# Patient Record
Sex: Female | Born: 1965 | Race: Black or African American | Hispanic: No
Health system: Southern US, Community
[De-identification: ages and names within clinical notes are randomized; demographics above are authoritative.]

---

## 2011-10-12 DIAGNOSIS — N949 Unspecified condition associated with female genital organs and menstrual cycle: Secondary | ICD-10-CM | POA: Insufficient documentation

## 2012-04-14 DIAGNOSIS — J3489 Other specified disorders of nose and nasal sinuses: Secondary | ICD-10-CM | POA: Insufficient documentation

## 2012-04-14 DIAGNOSIS — R430 Anosmia: Secondary | ICD-10-CM | POA: Insufficient documentation

## 2014-03-19 DIAGNOSIS — N3090 Cystitis, unspecified without hematuria: Secondary | ICD-10-CM | POA: Insufficient documentation

## 2016-05-05 DIAGNOSIS — R922 Inconclusive mammogram: Secondary | ICD-10-CM | POA: Insufficient documentation

## 2017-03-25 DIAGNOSIS — R4789 Other speech disturbances: Secondary | ICD-10-CM | POA: Insufficient documentation

## 2017-03-25 DIAGNOSIS — F449 Dissociative and conversion disorder, unspecified: Secondary | ICD-10-CM | POA: Insufficient documentation

## 2017-03-25 DIAGNOSIS — R531 Weakness: Secondary | ICD-10-CM | POA: Insufficient documentation

## 2017-03-25 DIAGNOSIS — I82619 Acute embolism and thrombosis of superficial veins of unspecified upper extremity: Secondary | ICD-10-CM | POA: Insufficient documentation

## 2019-07-29 DIAGNOSIS — Z6829 Body mass index (BMI) 29.0-29.9, adult: Secondary | ICD-10-CM | POA: Insufficient documentation

## 2019-12-28 DIAGNOSIS — N939 Abnormal uterine and vaginal bleeding, unspecified: Secondary | ICD-10-CM | POA: Insufficient documentation

## 2020-12-04 DIAGNOSIS — E1165 Type 2 diabetes mellitus with hyperglycemia: Secondary | ICD-10-CM | POA: Insufficient documentation

## 2021-04-25 ENCOUNTER — Emergency Department (HOSPITAL_COMMUNITY): Payer: BC Managed Care – PPO

## 2021-04-25 ENCOUNTER — Inpatient Hospital Stay (HOSPITAL_COMMUNITY)
Admission: EM | Admit: 2021-04-25 | Discharge: 2021-04-27 | DRG: 074 | Disposition: A | Discharge status: Home / Self Care | Payer: BC Managed Care – PPO | Attending: Neurology | Admitting: Neurology

## 2021-04-25 ENCOUNTER — Inpatient Hospital Stay (HOSPITAL_COMMUNITY): Payer: BC Managed Care – PPO

## 2021-04-25 ENCOUNTER — Encounter (HOSPITAL_COMMUNITY): Payer: Self-pay | Admitting: Radiology

## 2021-04-25 DIAGNOSIS — R531 Weakness: Secondary | ICD-10-CM | POA: Diagnosis present

## 2021-04-25 DIAGNOSIS — I639 Cerebral infarction, unspecified: Secondary | ICD-10-CM | POA: Diagnosis not present

## 2021-04-25 DIAGNOSIS — R030 Elevated blood-pressure reading, without diagnosis of hypertension: Secondary | ICD-10-CM | POA: Diagnosis present

## 2021-04-25 DIAGNOSIS — I6389 Other cerebral infarction: Secondary | ICD-10-CM | POA: Diagnosis not present

## 2021-04-25 DIAGNOSIS — I6300 Cerebral infarction due to thrombosis of unspecified precerebral artery: Secondary | ICD-10-CM | POA: Diagnosis not present

## 2021-04-25 DIAGNOSIS — Z6829 Body mass index (BMI) 29.0-29.9, adult: Secondary | ICD-10-CM | POA: Diagnosis not present

## 2021-04-25 DIAGNOSIS — Z79899 Other long term (current) drug therapy: Secondary | ICD-10-CM | POA: Diagnosis not present

## 2021-04-25 DIAGNOSIS — Z7984 Long term (current) use of oral hypoglycemic drugs: Secondary | ICD-10-CM

## 2021-04-25 DIAGNOSIS — E669 Obesity, unspecified: Secondary | ICD-10-CM | POA: Diagnosis present

## 2021-04-25 DIAGNOSIS — Z20822 Contact with and (suspected) exposure to covid-19: Secondary | ICD-10-CM | POA: Diagnosis present

## 2021-04-25 DIAGNOSIS — R079 Chest pain, unspecified: Secondary | ICD-10-CM | POA: Diagnosis present

## 2021-04-25 DIAGNOSIS — I6339 Cerebral infarction due to thrombosis of other cerebral artery: Secondary | ICD-10-CM | POA: Diagnosis not present

## 2021-04-25 DIAGNOSIS — E119 Type 2 diabetes mellitus without complications: Secondary | ICD-10-CM | POA: Diagnosis present

## 2021-04-25 DIAGNOSIS — G510 Bell's palsy: Principal | ICD-10-CM | POA: Diagnosis present

## 2021-04-25 LAB — COMPREHENSIVE METABOLIC PANEL
ALT: 12 U/L (ref 0–44)
AST: 14 U/L — ABNORMAL LOW (ref 15–41)
Albumin: 3.8 g/dL (ref 3.5–5.0)
Alkaline Phosphatase: 62 U/L (ref 38–126)
Anion gap: 8 (ref 5–15)
BUN: 10 mg/dL (ref 6–20)
CO2: 25 mmol/L (ref 22–32)
Calcium: 9.2 mg/dL (ref 8.9–10.3)
Chloride: 107 mmol/L (ref 98–111)
Creatinine, Ser: 0.8 mg/dL (ref 0.44–1.00)
GFR, Estimated: >60 mL/min (ref >60)
Glucose, Bld: 116 mg/dL — ABNORMAL HIGH (ref 70–99)
Potassium: 3.5 mmol/L (ref 3.5–5.1)
Sodium: 140 mmol/L (ref 135–145)
Total Bilirubin: 0.7 mg/dL (ref 0.3–1.2)
Total Protein: 6.7 g/dL (ref 6.5–8.1)

## 2021-04-25 LAB — CBC
HCT: 38.2 % (ref 36.0–46.0)
Hemoglobin: 12.6 g/dL (ref 12.0–15.0)
MCH: 28.6 pg (ref 26.0–34.0)
MCHC: 33 g/dL (ref 30.0–36.0)
MCV: 86.6 fL (ref 80.0–100.0)
Platelets: 295 10*3/uL (ref 150–400)
RBC: 4.41 MIL/uL (ref 3.87–5.11)
RDW: 13.5 % (ref 11.5–15.5)
WBC: 5.7 10*3/uL (ref 4.0–10.5)
nRBC: 0 % (ref 0.0–0.2)

## 2021-04-25 LAB — URINALYSIS, COMPLETE (UACMP) WITH MICROSCOPIC
Bilirubin Urine: NEGATIVE
Glucose, UA: NEGATIVE mg/dL
Ketones, ur: NEGATIVE mg/dL
Leukocytes,Ua: NEGATIVE
Nitrite: NEGATIVE
Protein, ur: NEGATIVE mg/dL
Specific Gravity, Urine: 1.04 — ABNORMAL HIGH (ref 1.005–1.030)
pH: 6 (ref 5.0–8.0)

## 2021-04-25 LAB — DIFFERENTIAL
Abs Immature Granulocytes: 0.01 10*3/uL (ref 0.00–0.07)
Basophils Absolute: 0 10*3/uL (ref 0.0–0.1)
Basophils Relative: 0 %
Eosinophils Absolute: 0.1 10*3/uL (ref 0.0–0.5)
Eosinophils Relative: 2 %
Immature Granulocytes: 0 %
Lymphocytes Relative: 39 %
Lymphs Abs: 2.2 10*3/uL (ref 0.7–4.0)
Monocytes Absolute: 0.3 10*3/uL (ref 0.1–1.0)
Monocytes Relative: 5 %
Neutro Abs: 3 10*3/uL (ref 1.7–7.7)
Neutrophils Relative %: 54 %

## 2021-04-25 LAB — I-STAT CHEM 8, ED
BUN: 13 mg/dL (ref 6–20)
Calcium, Ion: 1.15 mmol/L (ref 1.15–1.40)
Chloride: 105 mmol/L (ref 98–111)
Creatinine, Ser: 0.8 mg/dL (ref 0.44–1.00)
Glucose, Bld: 115 mg/dL — ABNORMAL HIGH (ref 70–99)
HCT: 38 % (ref 36.0–46.0)
Hemoglobin: 12.9 g/dL (ref 12.0–15.0)
Potassium: 3.7 mmol/L (ref 3.5–5.1)
Sodium: 142 mmol/L (ref 135–145)
TCO2: 26 mmol/L (ref 22–32)

## 2021-04-25 LAB — RAPID URINE DRUG SCREEN, HOSP PERFORMED
Amphetamines: NOT DETECTED
Barbiturates: NOT DETECTED
Benzodiazepines: NOT DETECTED
Cocaine: NOT DETECTED
Opiates: POSITIVE — AB
Tetrahydrocannabinol: POSITIVE — AB

## 2021-04-25 LAB — CBG MONITORING, ED: Glucose-Capillary: 112 mg/dL — ABNORMAL HIGH (ref 70–99)

## 2021-04-25 LAB — APTT: aPTT: 35 seconds (ref 24–36)

## 2021-04-25 LAB — I-STAT BETA HCG BLOOD, ED (MC, WL, AP ONLY): I-stat hCG, quantitative: <5 m[IU]/mL (ref <5)

## 2021-04-25 LAB — SARS CORONAVIRUS 2 (TAT 6-24 HRS): SARS Coronavirus 2: NEGATIVE

## 2021-04-25 LAB — TROPONIN I (HIGH SENSITIVITY)
Troponin I (High Sensitivity): <2 ng/L (ref <18)
Troponin I (High Sensitivity): 4 ng/L (ref <18)

## 2021-04-25 LAB — PROTIME-INR
INR: 1 (ref 0.8–1.2)
Prothrombin Time: 13 seconds (ref 11.4–15.2)

## 2021-04-25 LAB — HIV ANTIBODY (ROUTINE TESTING W REFLEX): HIV Screen 4th Generation wRfx: NONREACTIVE

## 2021-04-25 IMAGING — CT CT HEAD CODE STROKE
3 series · 14 of 47 positions shown, 16 images · non-contrast
Comparison: No pertinent prior exams available for comparison.

CLINICAL DATA: Code stroke. Neuro deficit, acute, stroke suspected.
Additional history provided: Headache, blurred vision, right leg
weakness.

EXAM:
CT HEAD WITHOUT CONTRAST
TECHNIQUE: Contiguous axial images were obtained from the base of the skull
through the vertex without intravenous contrast.

[Series 3: head 5.0 h30s · axial · 0.45mm/px · z∈[-82,+43]mm · 8 of 31 slices shown, 10 images]
[im 3/31  brain]
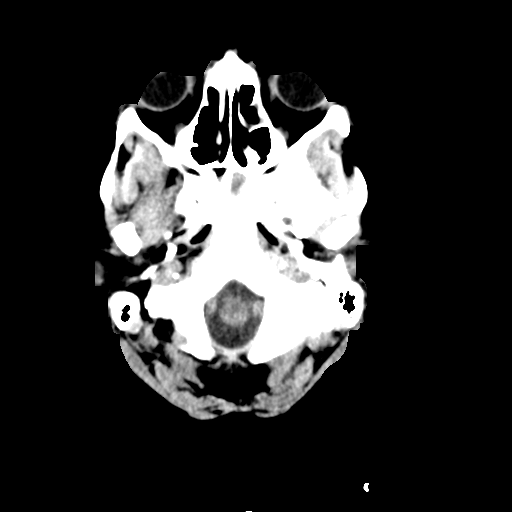
[im 3/31  bone]
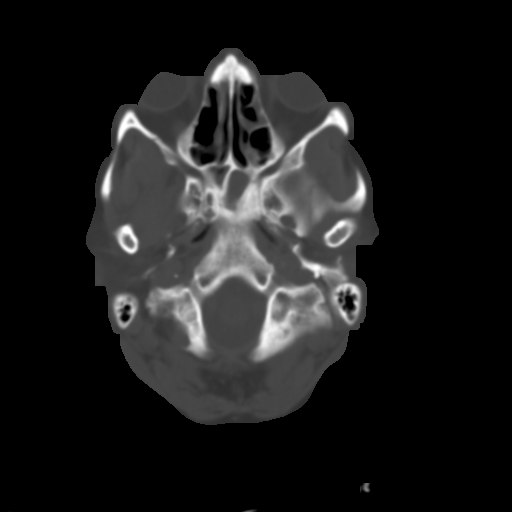
[im 7/31  brain]
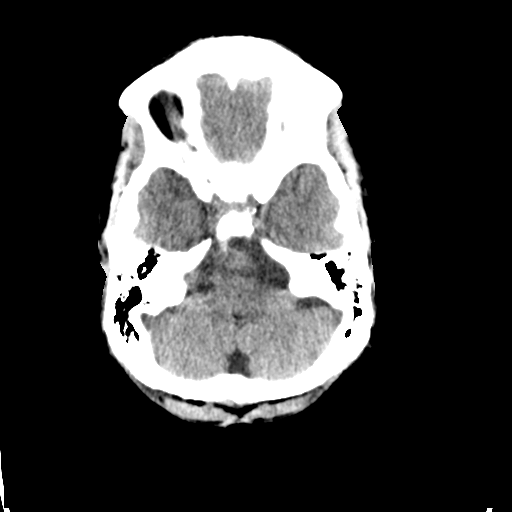
[im 10/31  brain]
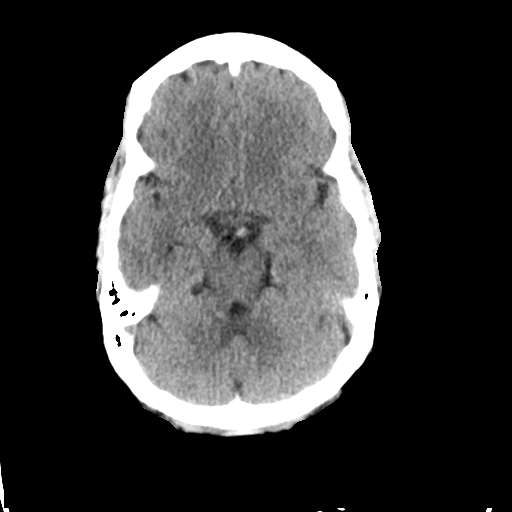
[im 14/31  brain]
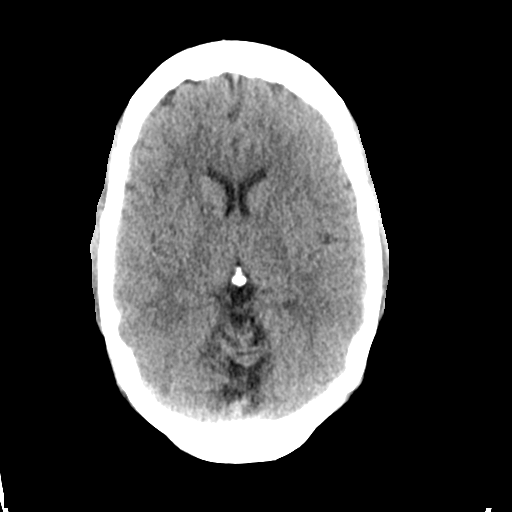
[im 17/31  brain]
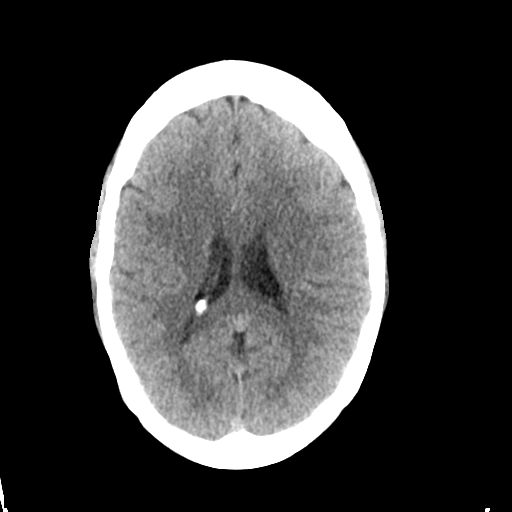
[im 17/31  bone]
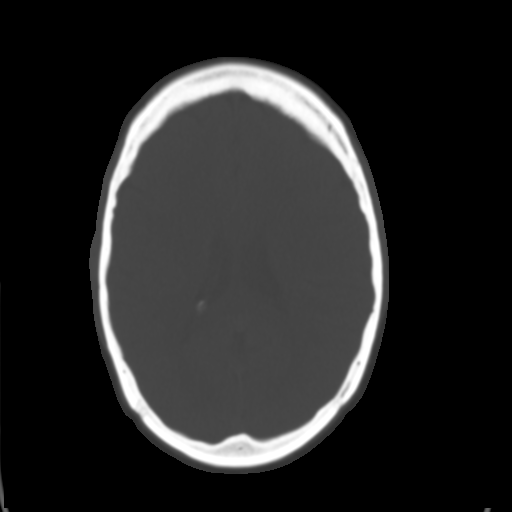
[im 21/31  brain]
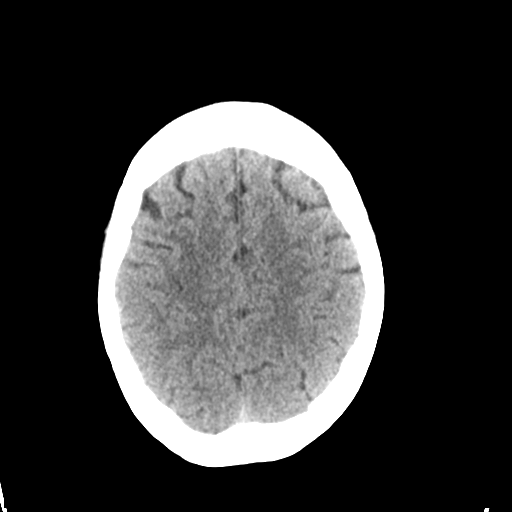
[im 24/31  brain]
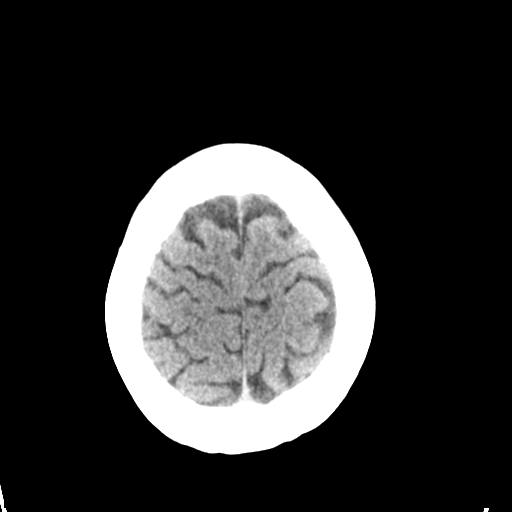
[im 28/31  brain]
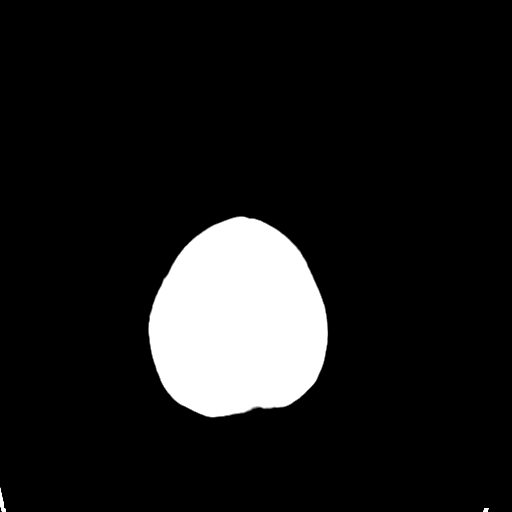

[Series 5: head 3.0 mpr cor · coronal · 0.33mm/px · 3 of 70 slices shown]
[im 24/70  brain]
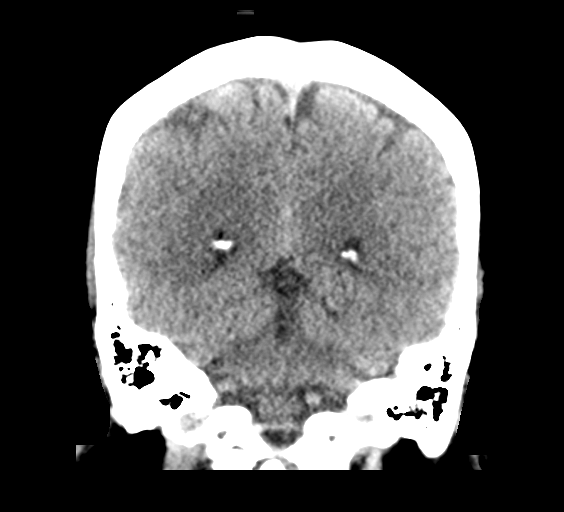
[im 31/70  brain]
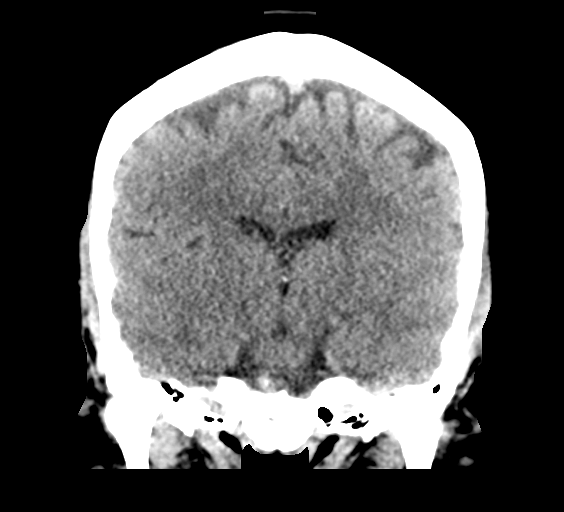
[im 39/70  brain]
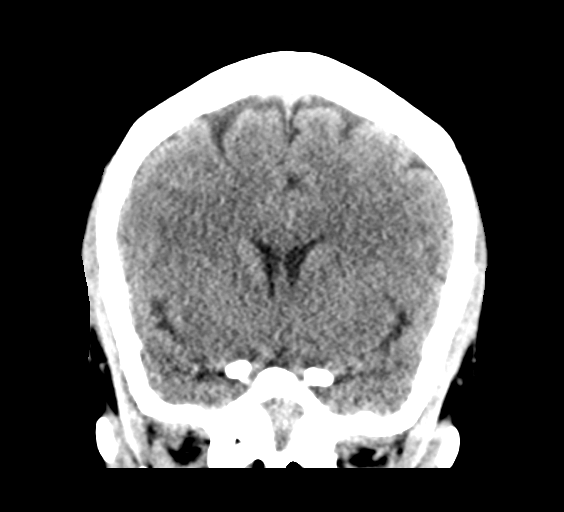

[Series 6: head 3.0 mpr sag · sagittal · 0.34mm/px · 3 of 49 slices shown]
[im 17/49  brain]
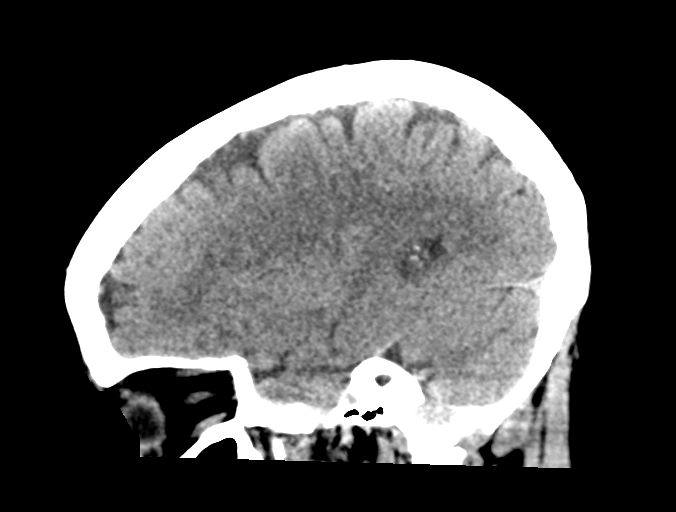
[im 25/49  brain]
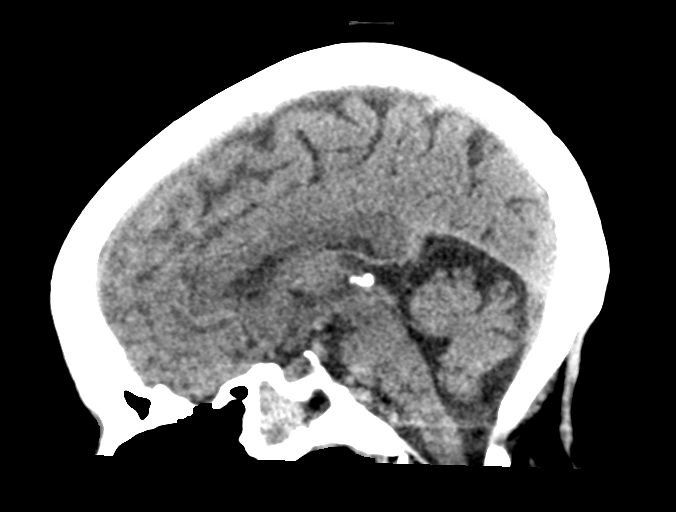
[im 33/49  brain]
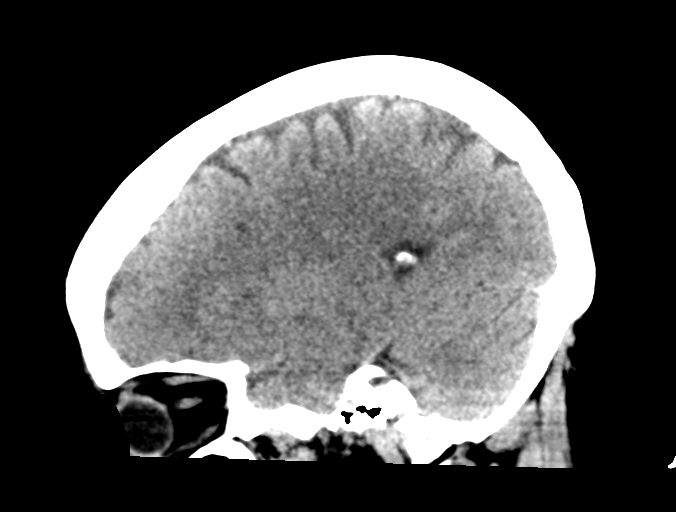

[14 of 47 positions shown; findings below may reference images not displayed]

FINDINGS: Brain:

Cerebral volume is normal.

Minimal patchy hypoattenuation within the cerebral white matter is
nonspecific, but most often secondary to chronic small vessel
ischemia.

There is no acute intracranial hemorrhage.

No demarcated cortical infarct.

No extra-axial fluid collection.

No evidence of an intracranial mass.

No midline shift.

Vascular: No hyperdense vessel.  Atherosclerotic calcifications

Skull: Normal. Negative for fracture or focal lesion.

Sinuses/Orbits: Visualized orbits show no acute finding. Mild
mucosal thickening and fluid within the bilateral ethmoid sinuses.
Mild mucosal thickening within the right sphenoid sinus. Complete
opacification of the left sphenoid sinus with associated chronic
reactive osteitis. Mild mucosal thickening within the visualized
maxillary sinuses with associated chronic reactive osteitis.

ASPECTS ([place] Stroke Program Early CT Score)

- Ganglionic level infarction (caudate, lentiform nuclei, internal
capsule, insula, M1-M3 cortex): 7

- Supraganglionic infarction (M4-M6 cortex): 3

Total score (0-10 with 10 being normal): 10

These results were communicated to [REDACTED] At [DATE] pmon
[DATE]by text page via the [employer] messaging system.
IMPRESSION: No evidence of acute intracranial abnormality.  ASPECTS is 10.

Mild patchy hypoattenuation within the cerebral white matter,
nonspecific but most often secondary to chronic small vessel
ischemia.

Paranasal sinus disease at the imaged levels, as described.

## 2021-04-25 IMAGING — CT CT ANGIO CHEST
2 of 6 series · 18 of 46 positions shown · IV contrast (OMNI)
Comparison: None

CLINICAL DATA: Chest pain, code stroke, question aortic dissection

EXAM:
CT ANGIOGRAPHY CHEST WITH CONTRAST
TECHNIQUE: Multidetector CT imaging of the chest was performed using the
standard protocol during bolus administration of intravenous
contrast. Multiplanar CT image reconstructions and MIPs were
obtained to evaluate the vascular anatomy. Precontrast images were
also obtained.
CONTRAST:  100mL OMNIPAQUE IOHEXOL 350 MG/ML SOLN IV

[Series 4: cta chest · axial · 0.68mm/px · z∈[-490,-244]mm · 15 of 135 slices shown]
[im 6/135  lung]
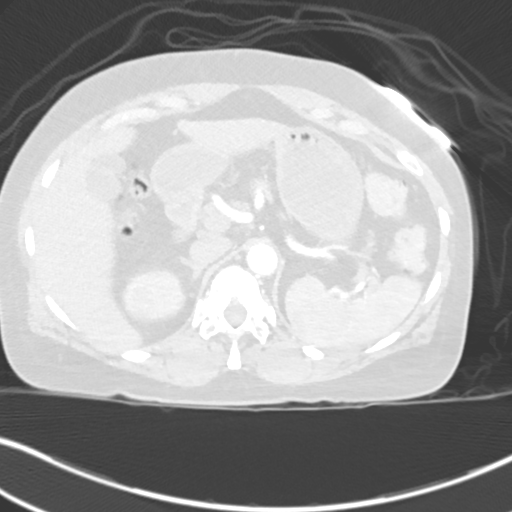
[im 18/135  soft-tissue]
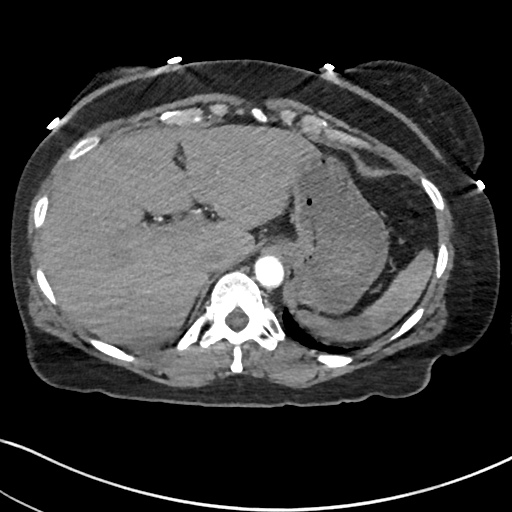
[im 24/135  lung]
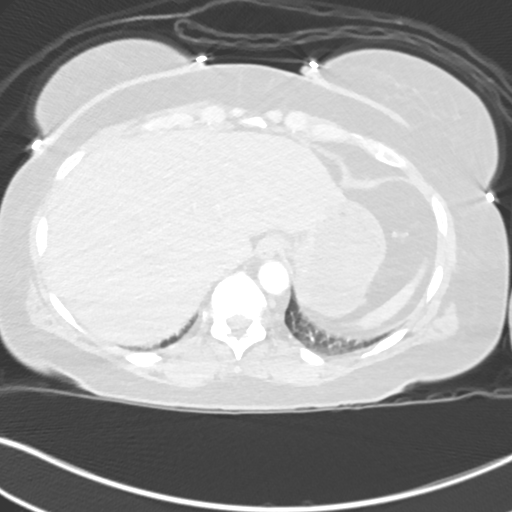
[im 35/135  soft-tissue]
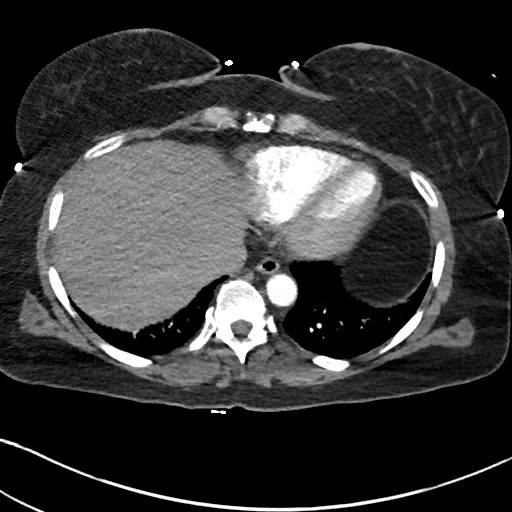
[im 41/135  lung]
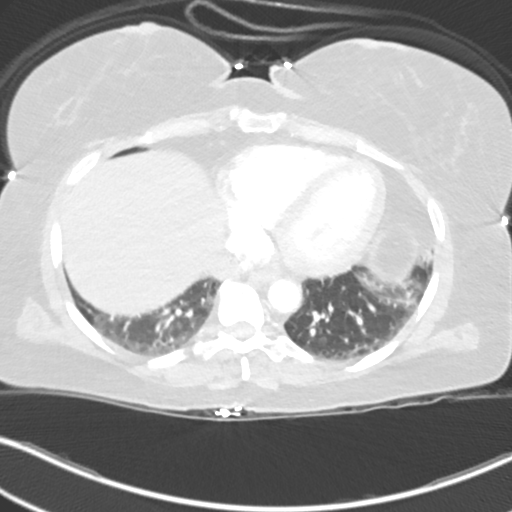
[im 53/135  soft-tissue]
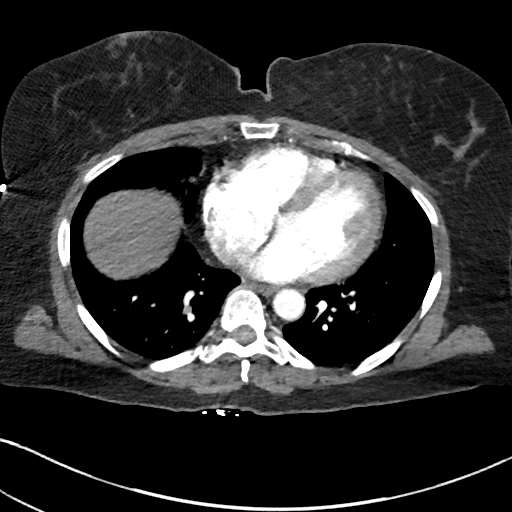
[im 59/135  lung]
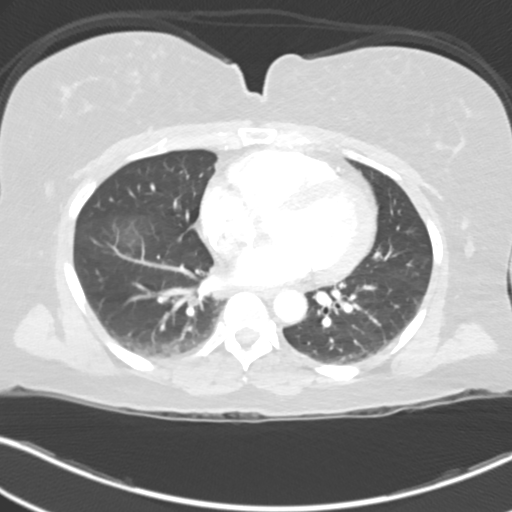
[im 70/135  soft-tissue]
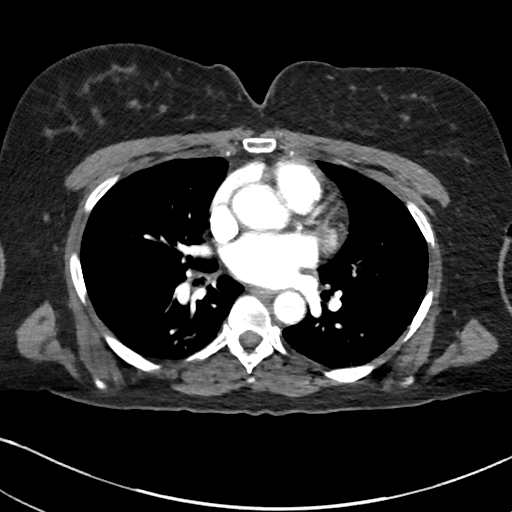
[im 76/135  lung]
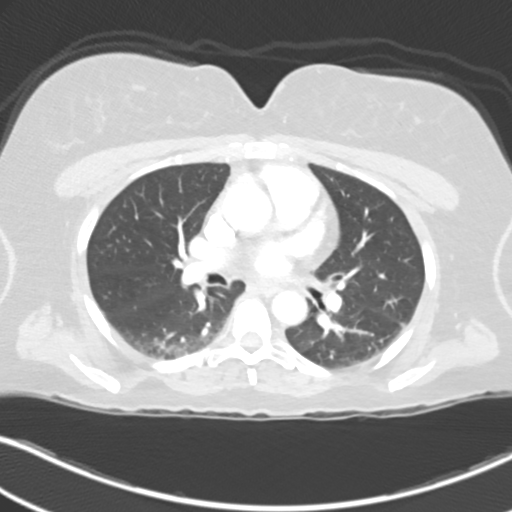
[im 82/135  soft-tissue]
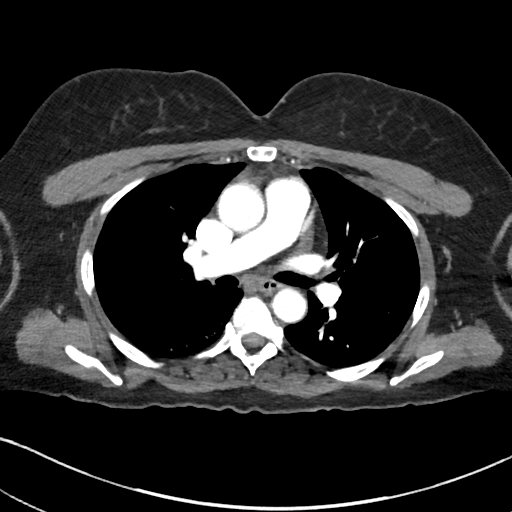
[im 94/135  lung]
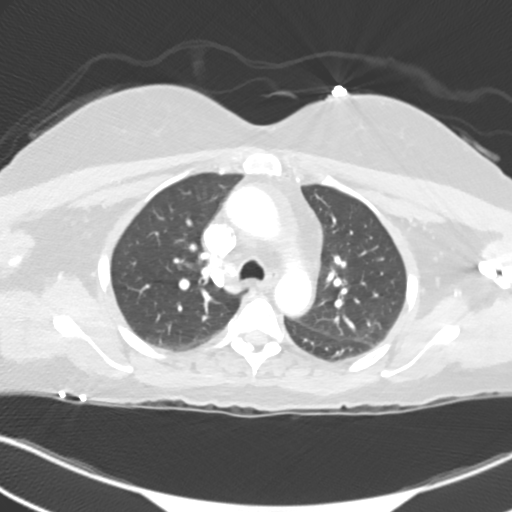
[im 100/135  soft-tissue]
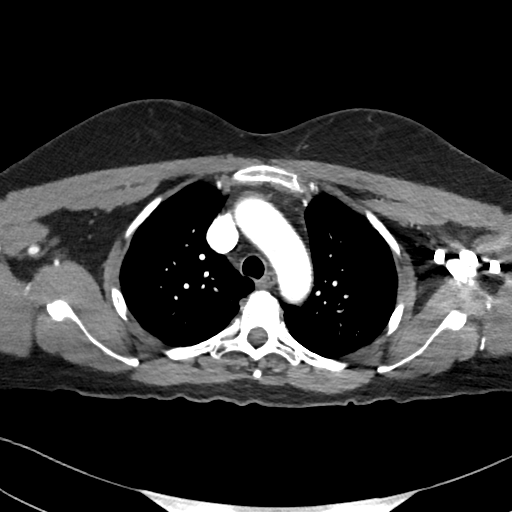
[im 111/135  lung]
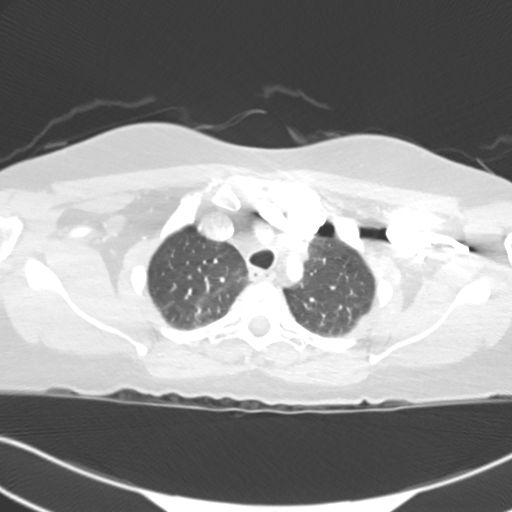
[im 117/135  soft-tissue]
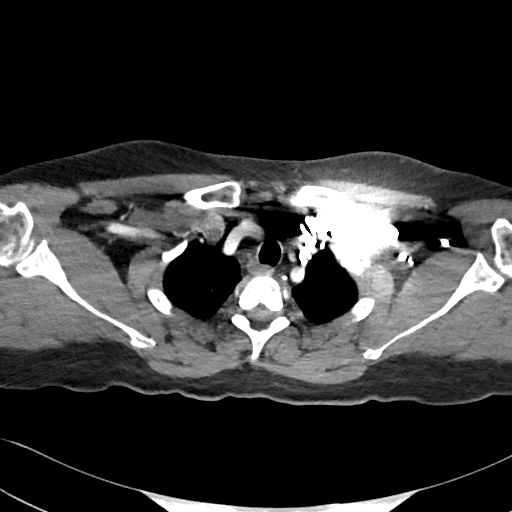
[im 129/135  lung]
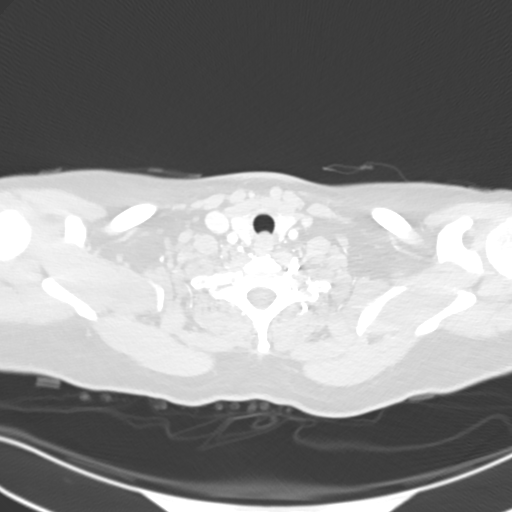

[Series 7: cor · coronal · 0.59mm/px · 3 of 114 slices shown]
[im 29/114  soft-tissue]
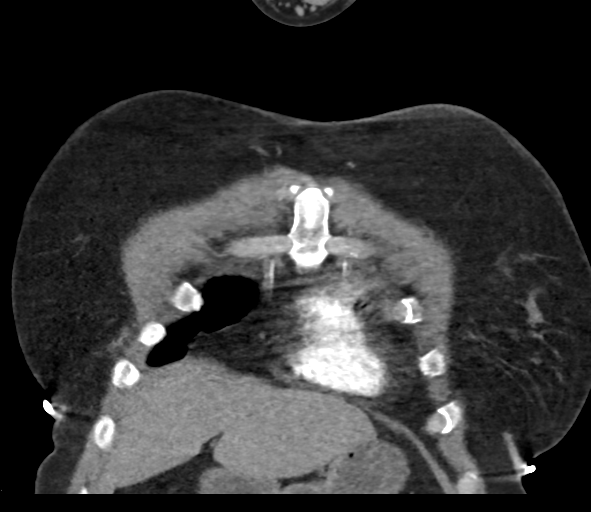
[im 57/114  soft-tissue]
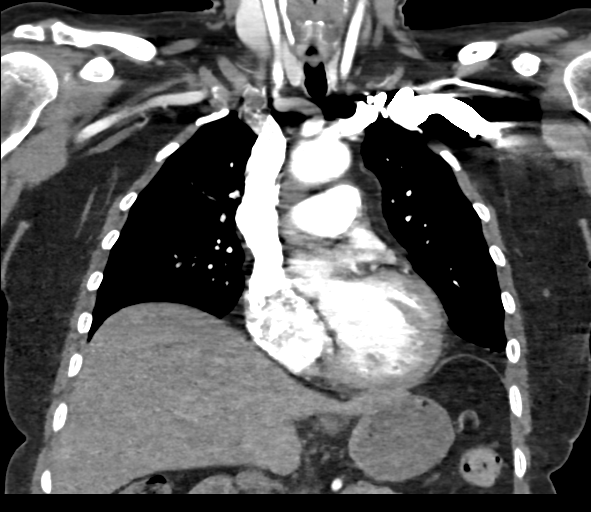
[im 85/114  soft-tissue]
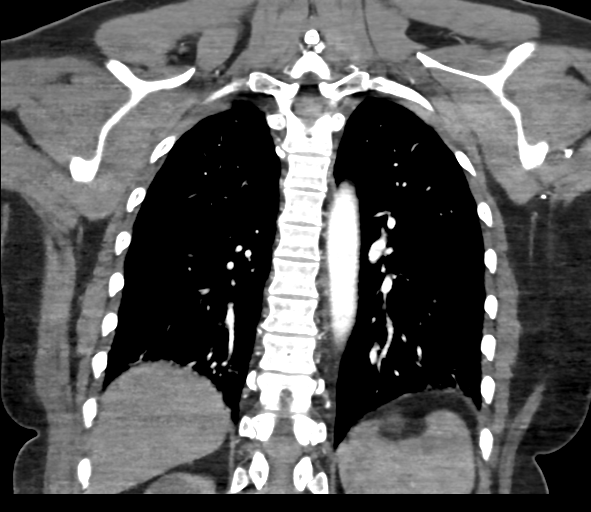

[18 of 46 positions shown; findings below may reference images not displayed]

FINDINGS: Cardiovascular: No intramural hematoma on precontrast imaging.
Normal aortic enhancement following IV contrast. Aorta normal
caliber. No evidence of aortic aneurysm or dissection. Pulmonary
arteries patent on non targeted exam. No evidence of pulmonary
embolism. Heart unremarkable. No pericardial effusion.

Mediastinum/Nodes: Esophagus unremarkable. Base of cervical region
normal appearance. No thoracic adenopathy.

Lungs/Pleura: Dependent atelectasis in the posterior lungs.
Remaining lungs clear. No pulmonary infiltrate, pleural effusion, or
pneumothorax.

Upper Abdomen: Low-attenuation foci at upper pole RIGHT kidney
question cysts. Remaining visualized upper abdomen unremarkable.

Musculoskeletal: No acute osseous findings.

Review of the MIP images confirms the above findings.
IMPRESSION: No evidence of aortic aneurysm or dissection.

No evidence of pulmonary embolism.

Dependent atelectasis in the posterior lungs.

Low-attenuation foci at upper pole RIGHT kidney question cysts.

Findings called to [REDACTED] on [DATE] at [BH] hours.

## 2021-04-25 IMAGING — CT CT ANGIO HEAD-NECK (W OR W/O PERF)
2 of 7 series · 9 of 47 positions shown · IV contrast (OMNI)
Comparison: Head CT earlier same day.

CLINICAL DATA: Neurological deficit. Acute stroke suspected.
Headache. Blurred vision. Right leg weakness.

EXAM:
CT ANGIOGRAPHY HEAD AND NECK
TECHNIQUE: Multidetector CT imaging of the head and neck was performed using
the standard protocol during bolus administration of intravenous
contrast. Multiplanar CT image reconstructions and MIPs were
obtained to evaluate the vascular anatomy. Carotid stenosis
measurements (when applicable) are obtained utilizing NASCET
criteria, using the distal internal carotid diameter as the
denominator.
CONTRAST:  100mL OMNIPAQUE IOHEXOL 350 MG/ML SOLN

[Series 7: carotid mips · axial · 0.50mm/px · z∈[-211,-96]mm · 2 of 69 slices shown]
[im 23/69  brain]
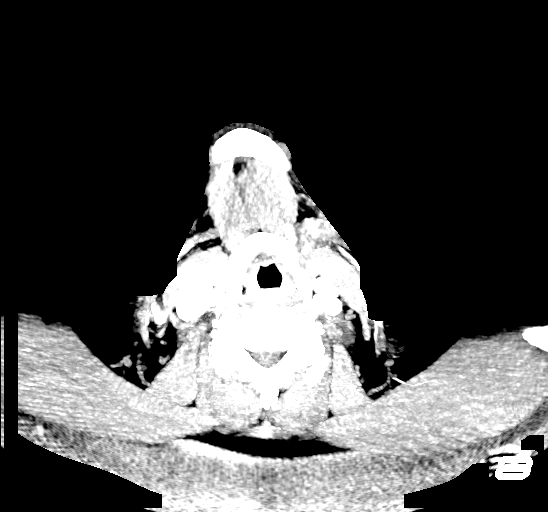
[im 46/69  brain]
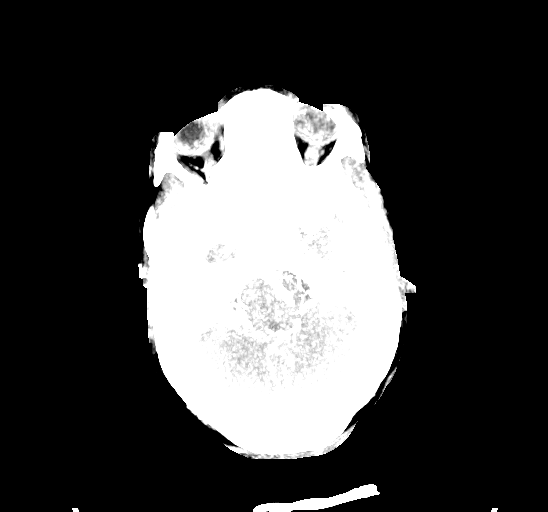

[Series 13: carotid brain · axial · 0.58mm/px · z∈[-276,-20]mm · 7 of 172 slices shown]
[im 22/172  brain]
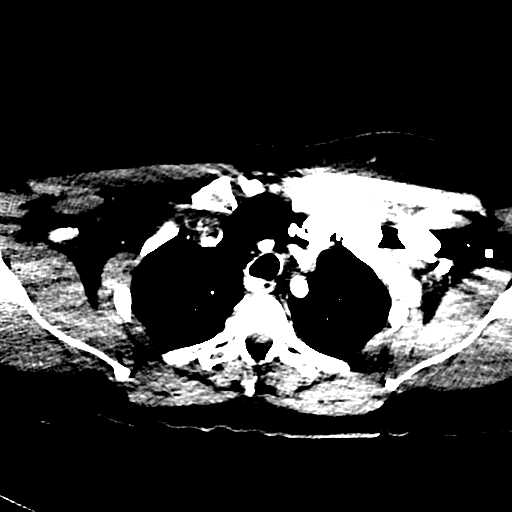
[im 43/172  bone]
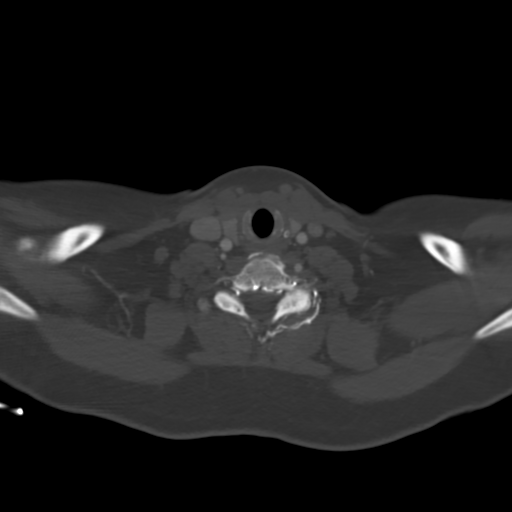
[im 65/172  brain]
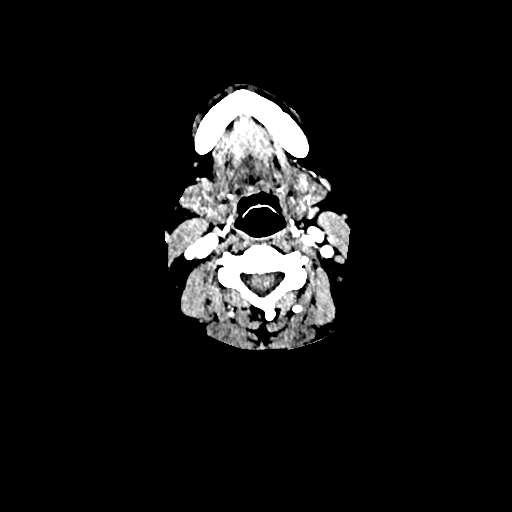
[im 86/172  bone]
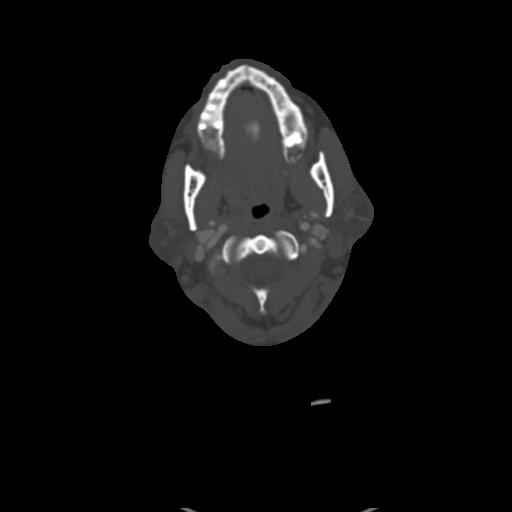
[im 107/172  brain]
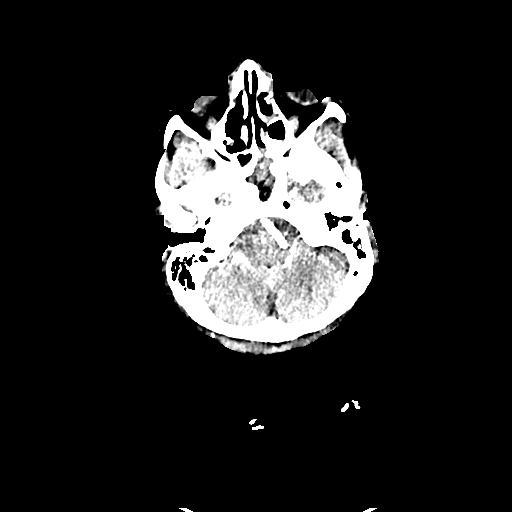
[im 129/172  bone]
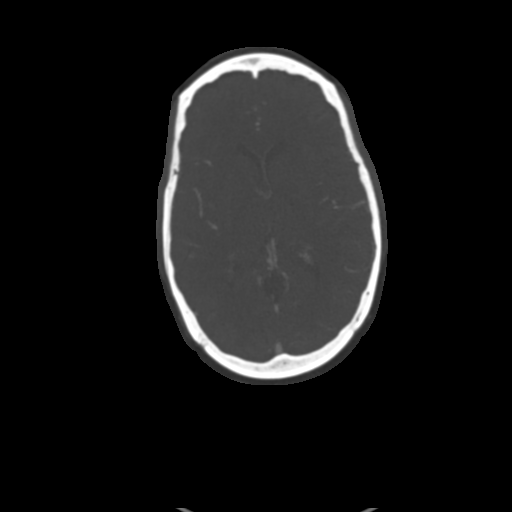
[im 150/172  brain]
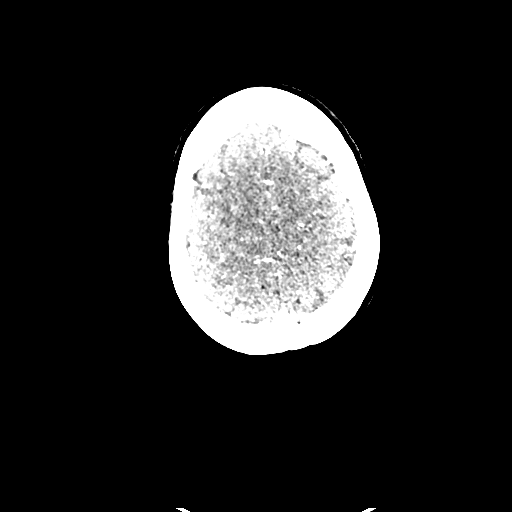

[9 of 47 positions shown; findings below may reference images not displayed]

FINDINGS: CTA NECK FINDINGS

Aortic arch: Aortic arch is normal.  Branching pattern is normal.

Right carotid system: Common carotid artery widely patent to the
bifurcation. Carotid bifurcation is normal without soft or calcified
plaque. Cervical ICA is normal.

Left carotid system: Common carotid artery widely patent to the
bifurcation. Carotid bifurcation is normal without soft or calcified
plaque. Cervical ICA is normal.

Vertebral arteries: Both vertebral artery origins are widely patent.
The right vertebral artery takes an early origin from the proximal
subclavian. Both vertebral arteries appear normal through the
cervical region to the foramen magnum.

Skeleton: Ordinary mid cervical spondylosis.

Other neck: No mass or lymphadenopathy.

Upper chest: Lung apices are clear.

Review of the MIP images confirms the above findings

CTA HEAD FINDINGS

Anterior circulation: Both internal carotid arteries are widely
patent through the skull base and siphon regions. The anterior and
middle cerebral vessels are patent. No large vessel occlusion. No
visible proximal stenosis. No aneurysm or vascular malformation.

Posterior circulation: Both vertebral arteries are widely patent to
the basilar. No basilar stenosis. Posterior circulation branch
vessels are normal.

Venous sinuses: Patent and normal.

Anatomic variants: None significant.

Review of the MIP images confirms the above findings
IMPRESSION: No large vessel occlusion.

Carotid bifurcations appear normal. No other atherosclerotic
vascular disease evident in the region.

## 2021-04-25 IMAGING — MR MR HEAD W/O CM
12 of 13 series · 44 of 48 positions shown · non-contrast
Comparison: None.

CLINICAL DATA: Neuro deficit, acute, stroke suspected

EXAM:
MRI HEAD WITHOUT CONTRAST
TECHNIQUE: Multiplanar, multiecho pulse sequences of the brain and surrounding
structures were obtained without intravenous contrast.

[Series 5: DWI · axial · 3.0mm · 0.88mm/px · z∈[-50,+87]mm · 7 of 100 slices shown (1 of 4)]
[im 1/100]
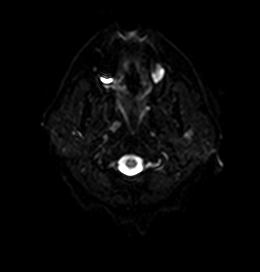
[im 17/100]
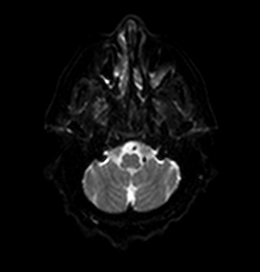
[im 34/100]
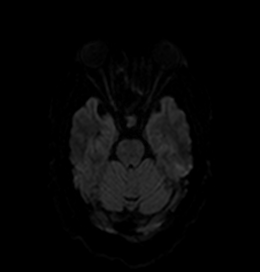
[im 50/100]
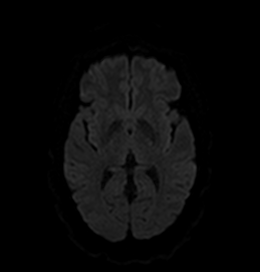
[im 67/100]
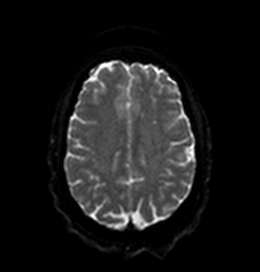
[im 83/100]
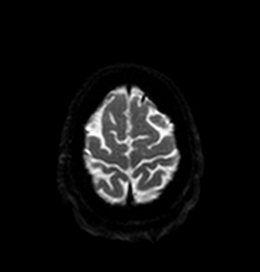
[im 100/100]
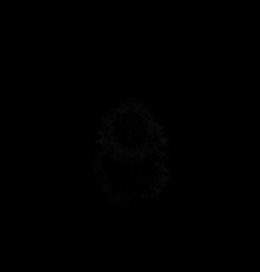

[Series 6: DWI · axial · 3.0mm · 0.88mm/px · z∈[-50,+87]mm · 4 of 50 slices shown (2 of 4)]
[im 1/50]
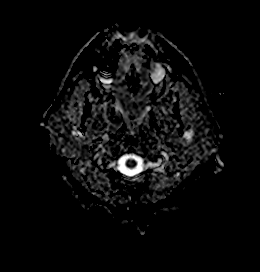
[im 17/50]
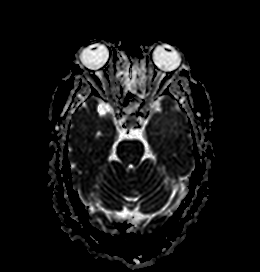
[im 33/50]
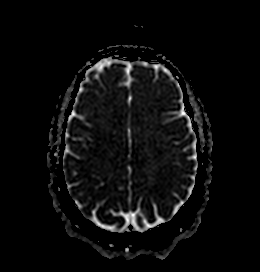
[im 50/50]
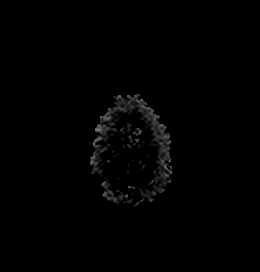

[Series 7: DWI · coronal · 4.0mm · 0.88mm/px · 6 of 72 slices shown (3 of 4)]
[im 1/72]
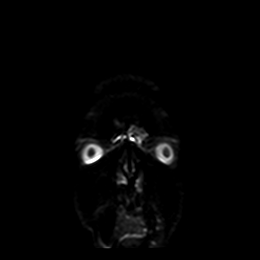
[im 15/72]
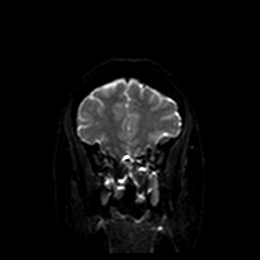
[im 29/72]
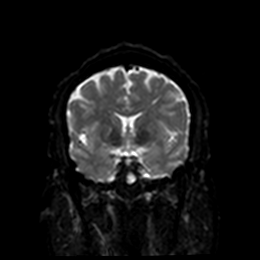
[im 43/72]
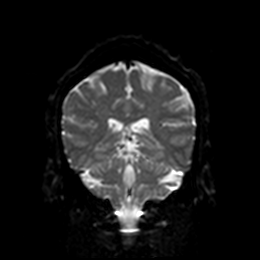
[im 57/72]
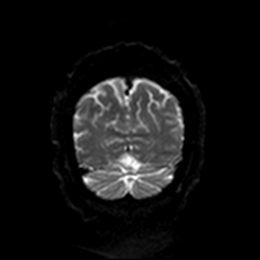
[im 72/72]
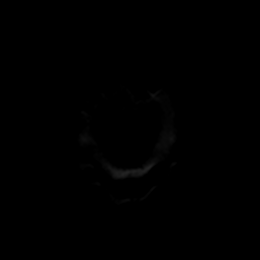

[Series 8: DWI · coronal · 4.0mm · 0.88mm/px · 3 of 36 slices shown (4 of 4)]
[im 1/36]
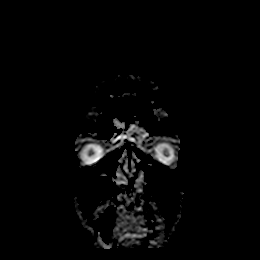
[im 18/36]
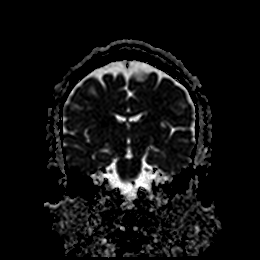
[im 36/36]
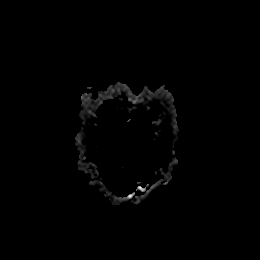

[Series 9: FLAIR · axial · 5.0mm · 0.45mm/px · z∈[-45,+89]mm · 2 of 25 slices shown]
[im 1/25]
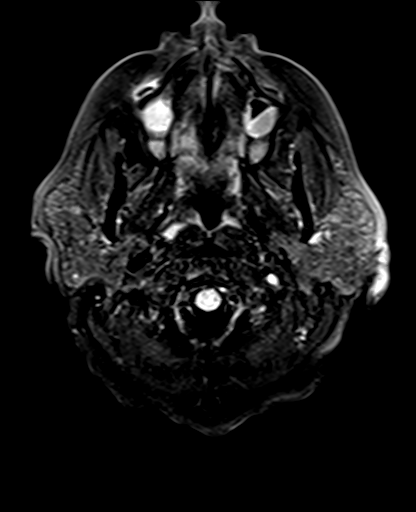
[im 25/25]
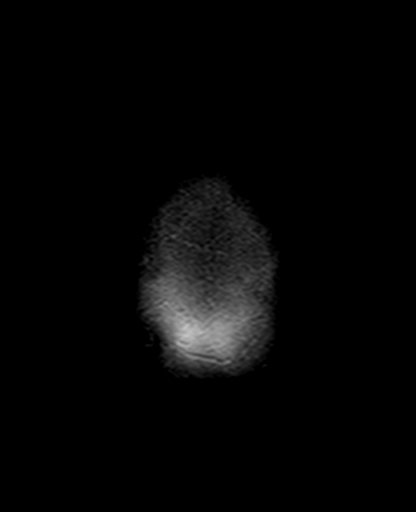

[Series 10: mag_images · axial · 3.0mm · 0.90mm/px · z∈[-48,+94]mm · 4 of 52 slices shown]
[im 1/52]
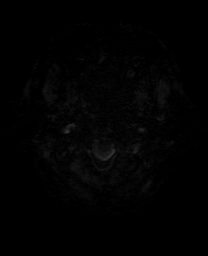
[im 18/52]
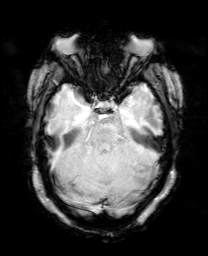
[im 35/52]
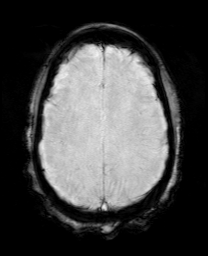
[im 52/52]
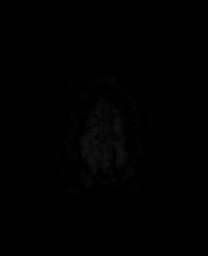

[Series 11: pha_images · axial · 3.0mm · 0.90mm/px · z∈[-48,+94]mm · 4 of 52 slices shown]
[im 1/52]
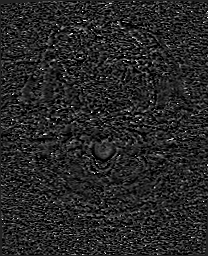
[im 18/52]
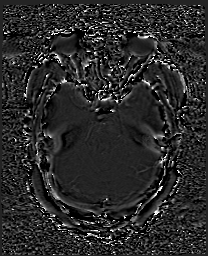
[im 35/52]
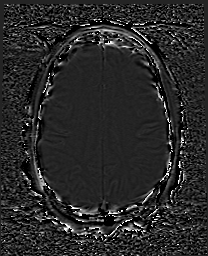
[im 52/52]
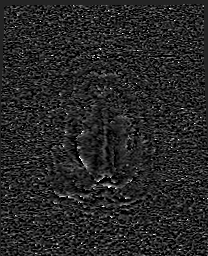

[Series 12: swi_images · axial · 3.0mm · 0.90mm/px · z∈[-48,+94]mm · 4 of 52 slices shown]
[im 1/52]
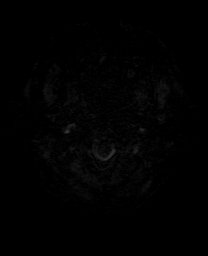
[im 18/52]
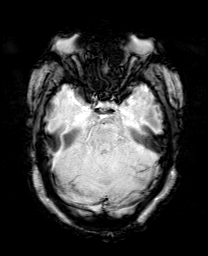
[im 35/52]
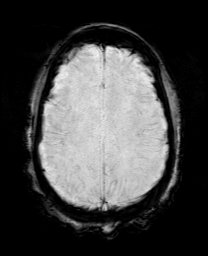
[im 52/52]
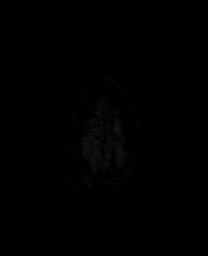

[Series 13: mip_images(sw) · axial · 24.0mm · 0.90mm/px · z∈[-38,+84]mm · 4 of 45 slices shown]
[im 1/45]
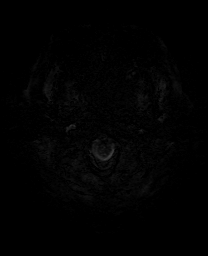
[im 15/45]
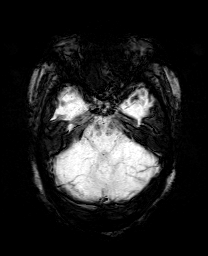
[im 30/45]
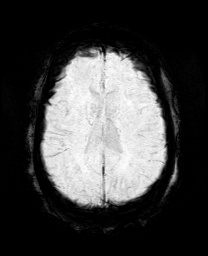
[im 45/45]
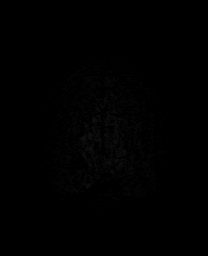

[Series 14: T1 · sagittal · 5.0mm · 0.75mm/px · 2 of 23 slices shown]
[im 1/23]
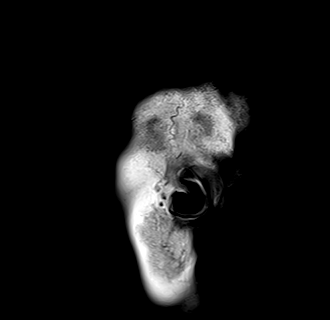
[im 23/23]
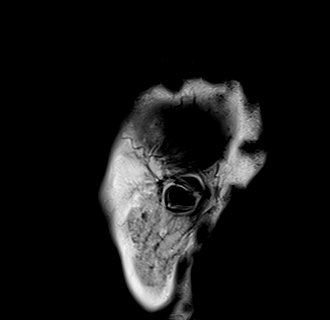

[Series 15: T2 · axial · 5.0mm · 0.72mm/px · z∈[-43,+91]mm · 2 of 25 slices shown (1 of 2)]
[im 1/25]
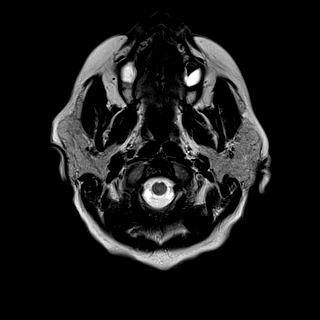
[im 25/25]
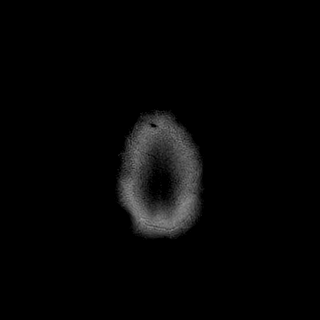

[Series 17: T2 · coronal · 5.0mm · 0.34mm/px · 2 of 30 slices shown (2 of 2)]
[im 1/30]
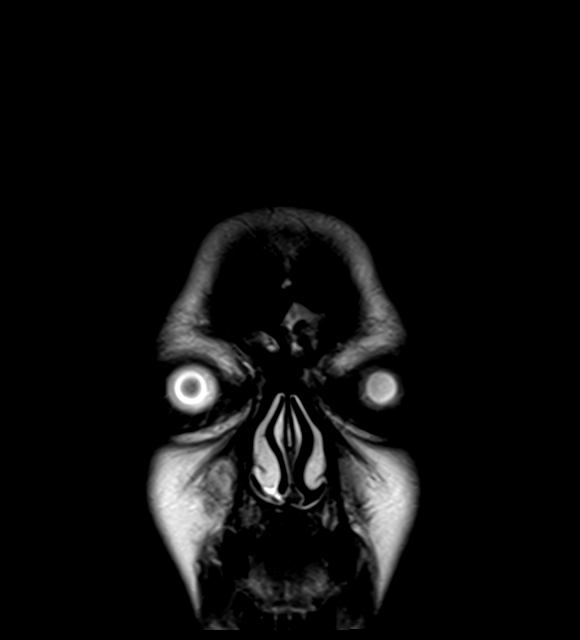
[im 30/30]
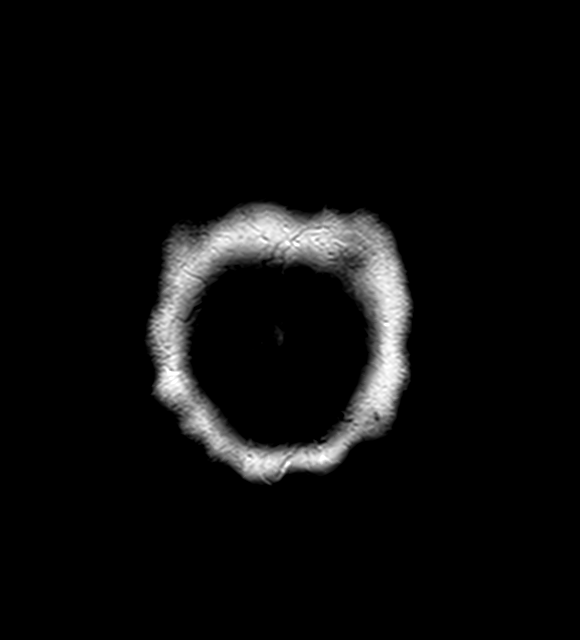

[44 of 48 positions shown; findings below may reference images not displayed]

FINDINGS: Brain: No acute infarct, mass effect or extra-axial collection. No
acute or chronic hemorrhage. Normal white matter signal, parenchymal
volume and CSF spaces. The midline structures are normal.

Vascular: Major flow voids are preserved.

Skull and upper cervical spine: Normal calvarium and skull base.
Visualized upper cervical spine and soft tissues are normal.

Sinuses/Orbits:No paranasal sinus fluid levels or advanced mucosal
thickening. No mastoid or middle ear effusion. Normal orbits.
IMPRESSION: Normal brain MRI.

## 2021-04-25 MED ORDER — ALTEPLASE (STROKE) FULL DOSE INFUSION
0.9000 mg/kg | Freq: Once | INTRAVENOUS | Status: AC
  Administered 2021-04-25: 78.3 mg via INTRAVENOUS

## 2021-04-25 MED ORDER — STROKE: EARLY STAGES OF RECOVERY BOOK
Freq: Once | Status: AC
  Filled 2021-04-25 (×2): qty 1

## 2021-04-25 MED ORDER — PANTOPRAZOLE SODIUM 40 MG IV SOLR
40.0000 mg | Freq: Every day | INTRAVENOUS | Status: DC
  Administered 2021-04-25: 40 mg via INTRAVENOUS
  Filled 2021-04-25: qty 40

## 2021-04-25 MED ORDER — ACETAMINOPHEN 160 MG/5ML PO SOLN: 650.0000 mg | ORAL | Status: DC | PRN

## 2021-04-25 MED ORDER — SODIUM CHLORIDE 0.9 % IV SOLN
50.0000 mL | Freq: Once | INTRAVENOUS | Status: AC
  Administered 2021-04-25: 50 mL via INTRAVENOUS

## 2021-04-25 MED ORDER — LORAZEPAM 2 MG/ML IJ SOLN
1.0000 mg | Freq: Once | INTRAMUSCULAR | Status: AC | PRN
  Administered 2021-04-25: 1 mg via INTRAVENOUS
  Filled 2021-04-25: qty 1

## 2021-04-25 MED ORDER — MORPHINE SULFATE (PF) 4 MG/ML IV SOLN
4.0000 mg | Freq: Once | INTRAVENOUS | Status: AC
  Administered 2021-04-25: 4 mg via INTRAVENOUS
  Filled 2021-04-25: qty 1

## 2021-04-25 MED ORDER — CHLORHEXIDINE GLUCONATE CLOTH 2 % EX PADS
6.0000 | MEDICATED_PAD | Freq: Every day | CUTANEOUS | Status: DC
  Administered 2021-04-25 – 2021-04-27 (×3): 6 via TOPICAL

## 2021-04-25 MED ORDER — SENNOSIDES-DOCUSATE SODIUM 8.6-50 MG PO TABS: 1.0000 | ORAL_TABLET | Freq: Every evening | ORAL | Status: DC | PRN

## 2021-04-25 MED ORDER — SODIUM CHLORIDE 0.9% FLUSH: 3.0000 mL | Freq: Once | INTRAVENOUS | Status: DC

## 2021-04-25 MED ORDER — IOHEXOL 350 MG/ML SOLN
100.0000 mL | Freq: Once | INTRAVENOUS | Status: AC | PRN
  Administered 2021-04-25: 100 mL via INTRAVENOUS

## 2021-04-25 MED ORDER — CLEVIDIPINE BUTYRATE 0.5 MG/ML IV EMUL: 0.0000 mg/h | INTRAVENOUS | Status: DC

## 2021-04-25 MED ORDER — ACETAMINOPHEN 325 MG PO TABS
650.0000 mg | ORAL_TABLET | ORAL | Status: DC | PRN
  Administered 2021-04-25 – 2021-04-26 (×2): 650 mg via ORAL
  Filled 2021-04-25 (×2): qty 2

## 2021-04-25 MED ORDER — ACETAMINOPHEN 650 MG RE SUPP: 650.0000 mg | RECTAL | Status: DC | PRN

## 2021-04-25 MED ORDER — SODIUM CHLORIDE 0.9 % IV SOLN: INTRAVENOUS | Status: DC

## 2021-04-25 NOTE — ED Provider Notes (Signed)
[facility] EMERGENCY DEPARTMENT Provider Note   CSN: [id number] Arrival date & time: 04/25/21  1227  An emergency department physician performed an initial assessment on this suspected stroke patient at 1227.  History Chief complaint: Chest pain weakness  Melissa Olsen is a 55 y.o. female.  HPI  Patient presented with acute onset of chest pain as well as numbness and weakness.  Patient was activated as a code stroke out in the field.  Patient's symptoms were primarily on the right arm and right leg.  She has not had any fevers.  No shortness of breath.  Pain in her chest is primarily sharp.  She denies any history of heart disease.  No known aortic disease.  History reviewed. No pertinent past medical history.  Patient Active Problem List   Diagnosis Date Noted   Stroke [facility]) 04/25/2021   Chest pain      History reviewed. No pertinent surgical history.   OB History   No obstetric history on file.     History reviewed. No pertinent family history.     Home Medications Prior to Admission medications   Medication Sig Start Date End Date Taking? Authorizing Provider  esomeprazole (NEXIUM) 40 MG capsule Take 40 mg by mouth daily. 03/07/21  Yes [provider]  metFORMIN (GLUCOPHAGE) 500 MG tablet Take 500 mg by mouth every morning. 04/19/21  Yes [provider]  Multiple Vitamin (MULTIVITAMIN) capsule Take 1 capsule by mouth daily.   Yes [provider]    Allergies    Patient has no allergy information on record.  Review of Systems   Review of Systems  All other systems reviewed and are negative.  Physical Exam Updated Vital Signs BP 123/75 (BP Location: Right Arm)   Pulse 65   Temp 98.1 F (36.7 C) (Oral)   Resp 12   Ht 1.727 m (5\' 8" )   Wt 87 kg   SpO2 96%   BMI 29.16 kg/m   Physical Exam Vitals and nursing note reviewed.  Constitutional:      General: She is not in acute distress.    Appearance: She is  well-developed.  HENT:     Head: Normocephalic and atraumatic.     Right Ear: External ear normal.     Left Ear: External ear normal.  Eyes:     General: No scleral icterus.       Right eye: No discharge.        Left eye: No discharge.     Conjunctiva/sclera: Conjunctivae normal.  Neck:     Trachea: No tracheal deviation.  Cardiovascular:     Rate and Rhythm: Normal rate and regular rhythm.     Comments: Tenderness palpation chest wall Pulmonary:     Effort: Pulmonary effort is normal. No respiratory distress.     Breath sounds: Normal breath sounds. No stridor. No wheezing or rales.  Abdominal:     General: Bowel sounds are normal. There is no distension.     Palpations: Abdomen is soft.     Tenderness: There is no abdominal tenderness. There is no guarding or rebound.  Musculoskeletal:        General: No tenderness or deformity.     Cervical back: Neck supple.  Skin:    General: Skin is warm and dry.     Findings: No rash.  Neurological:     General: No focal deficit present.     Mental Status: She is alert.     Cranial  Nerves: No cranial nerve deficit (no facial droop, extraocular movements intact, no slurred speech).     Sensory: Sensory deficit present.     Motor: Weakness present. No abnormal muscle tone or seizure activity.     Coordination: Coordination normal.     Comments: Slight weakness, right upper and rle  Psychiatric:        Mood and Affect: Mood normal.    ED Results / Procedures / Treatments   Labs (all labs ordered are listed, but only abnormal results are displayed) Labs Reviewed  COMPREHENSIVE METABOLIC PANEL - Abnormal; Notable for the following components:      Result Value   Glucose, Bld 116 (*)    AST 14 (*)    All other components within normal limits  I-STAT CHEM 8, ED - Abnormal; Notable for the following components:   Glucose, Bld 115 (*)    All other components within normal limits  CBG MONITORING, ED - Abnormal; Notable for the  following components:   Glucose-Capillary 112 (*)    All other components within normal limits  SARS CORONAVIRUS 2 (TAT 6-24 HRS)  PROTIME-INR  APTT  CBC  DIFFERENTIAL  HIV ANTIBODY (ROUTINE TESTING W REFLEX)  URINALYSIS, COMPLETE (UACMP) WITH MICROSCOPIC  RAPID URINE DRUG SCREEN, HOSP PERFORMED  I-STAT BETA HCG BLOOD, ED ([facility], [facility], [facility] ONLY)  TROPONIN I (HIGH SENSITIVITY)  TROPONIN I (HIGH SENSITIVITY)    EKG EKG Interpretation  Date/Time:  Wednesday April 25 2021 13:27:18 EDT Ventricular Rate:  79 PR Interval:  161 QRS Duration: 88 QT Interval:  361 QTC Calculation: 414 R Axis:   60 Text Interpretation: Sinus rhythm Abnormal R-wave progression, early transition No old tracing to compare Confirmed by Dorie Rank [phone]) on 04/25/2021 1:49:20 PM  Radiology CT ANGIO CHEST AORTA W/CM & OR WO/CM  Result Date: 04/25/2021 CLINICAL DATA:  Chest pain, code stroke, question aortic dissection EXAM: CT ANGIOGRAPHY CHEST WITH CONTRAST TECHNIQUE: Multidetector CT imaging of the chest was performed using the standard protocol during bolus administration of intravenous contrast. Multiplanar CT image reconstructions and MIPs were obtained to evaluate the vascular anatomy. Precontrast images were also obtained. CONTRAST:  133mL OMNIPAQUE IOHEXOL 350 MG/ML SOLN IV COMPARISON:  None FINDINGS: Cardiovascular: No intramural hematoma on precontrast imaging. Normal aortic enhancement following IV contrast. Aorta normal caliber. No evidence of aortic aneurysm or dissection. Pulmonary arteries patent on non targeted exam. No evidence of pulmonary embolism. Heart unremarkable. No pericardial effusion. Mediastinum/Nodes: Esophagus unremarkable. Base of cervical region normal appearance. No thoracic adenopathy. Lungs/Pleura: Dependent atelectasis in the posterior lungs. Remaining lungs clear. No pulmonary infiltrate, pleural effusion, or pneumothorax. Upper Abdomen: Low-attenuation foci at upper pole RIGHT kidney  question cysts. Remaining visualized upper abdomen unremarkable. Musculoskeletal: No acute osseous findings. Review of the MIP images confirms the above findings. IMPRESSION: No evidence of aortic aneurysm or dissection. No evidence of pulmonary embolism. Dependent atelectasis in the posterior lungs. Low-attenuation foci at upper pole RIGHT kidney question cysts. Findings called to Dr. Quinn Axe on 04/25/2021 at 1317 hours. Electronically Signed   By: Lavonia Dana M.D.   On: 04/25/2021 13:17   CT HEAD CODE STROKE WO CONTRAST  Result Date: 04/25/2021 CLINICAL DATA:  Code stroke. Neuro deficit, acute, stroke suspected. Additional history provided: Headache, blurred vision, right leg weakness. EXAM: CT HEAD WITHOUT CONTRAST TECHNIQUE: Contiguous axial images were obtained from the base of the skull through the vertex without intravenous contrast. COMPARISON:  No pertinent prior exams available for comparison. FINDINGS: Brain: Cerebral volume  is normal. Minimal patchy hypoattenuation within the cerebral white matter is nonspecific, but most often secondary to chronic small vessel ischemia. There is no acute intracranial hemorrhage. No demarcated cortical infarct. No extra-axial fluid collection. No evidence of an intracranial mass. No midline shift. Vascular: No hyperdense vessel.  Atherosclerotic calcifications Skull: Normal. Negative for fracture or focal lesion. Sinuses/Orbits: Visualized orbits show no acute finding. Mild mucosal thickening and fluid within the bilateral ethmoid sinuses. Mild mucosal thickening within the right sphenoid sinus. 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 Mild mucosal thickening within the visualized maxillary sinuses with associated chronic reactive osteitis. ASPECTS ([place] Stroke Program Early CT Score) - Ganglionic level infarction (caudate, lentiform nuclei, internal capsule, insula, M1-M3 cortex): 7 - Supraganglionic infarction  (M4-M6 cortex): 3 Total score (0-10 with 10 being normal): 10 These results were communicated to Dr. Quinn Axe At 12:46 pmon 7/20/2022by text page via the [facility] messaging system. IMPRESSION: No evidence of acute intracranial abnormality.  ASPECTS is 10. Mild patchy hypoattenuation within the cerebral white matter, nonspecific but most often secondary to chronic small vessel ischemia. Paranasal sinus disease at the imaged levels, as described. Electronically Signed   By: Kellie Simmering DO   On: 04/25/2021 12:47   CT ANGIO HEAD NECK W WO CM (CODE STROKE)  Result Date: 04/25/2021 CLINICAL DATA:  Neurological deficit. Acute stroke suspected. Headache. Blurred vision. Right leg weakness. EXAM: CT ANGIOGRAPHY HEAD AND NECK TECHNIQUE: Multidetector CT imaging of the head and neck was performed using the standard protocol during bolus administration of intravenous contrast. Multiplanar CT image reconstructions and MIPs were obtained to evaluate the vascular anatomy. Carotid stenosis measurements (when applicable) are obtained utilizing NASCET criteria, using the distal internal carotid diameter as the denominator. CONTRAST:  17mL OMNIPAQUE IOHEXOL 350 MG/ML SOLN COMPARISON:  Head CT earlier same day. FINDINGS: CTA NECK FINDINGS Aortic arch: Aortic arch is normal.  Branching pattern is normal. Right carotid system: Common carotid artery widely patent to the bifurcation. Carotid bifurcation is normal without soft or calcified plaque. Cervical ICA is normal. Left carotid system: Common carotid artery widely patent to the bifurcation. Carotid bifurcation is normal without soft or calcified plaque. Cervical ICA is normal. Vertebral arteries: Both vertebral artery origins are widely patent. The right vertebral artery takes an early origin from the proximal subclavian. Both vertebral arteries appear normal through the cervical region to the foramen magnum. Skeleton: Ordinary mid cervical spondylosis. Other neck: No mass or  lymphadenopathy. Upper chest: Lung apices are clear. Review of the MIP images confirms the above findings CTA HEAD FINDINGS Anterior circulation: Both internal carotid arteries are widely patent through the skull base and siphon regions. The anterior and middle cerebral vessels are patent. No large vessel occlusion. No visible proximal stenosis. No aneurysm or vascular malformation. Posterior circulation: Both vertebral arteries are widely patent to the basilar. No basilar stenosis. Posterior circulation branch vessels are normal. Venous sinuses: Patent and normal. Anatomic variants: None significant. Review of the MIP images confirms the above findings IMPRESSION: No large vessel occlusion. Carotid bifurcations appear normal. No other atherosclerotic vascular disease evident in the region. Electronically Signed   By: Nelson Chimes M.D.   On: 04/25/2021 13:12    Procedures .Critical Care  Date/Time: 04/25/2021 1:53 PM Performed by: Dorie Rank, MD Authorized by: Dorie Rank, MD   Critical care provider statement:    Critical care time (minutes):  30   Critical care was time spent personally by me on the following  activities:  Discussions with consultants, evaluation of patient's response to treatment, examination of patient, ordering and performing treatments and interventions, ordering and review of laboratory studies, ordering and review of radiographic studies, pulse oximetry, re-evaluation of patient's condition, obtaining history from patient or surrogate and review of old charts   Medications Ordered in ED Medications  sodium chloride flush (NS) 0.9 % injection 3 mL (has no administration in time range)   stroke: mapping our early stages of recovery book (has no administration in time range)  0.9 %  sodium chloride infusion (has no administration in time range)  acetaminophen (TYLENOL) tablet 650 mg (650 mg Oral Given 04/25/21 1442)    Or  acetaminophen (TYLENOL) 160 MG/5ML solution 650 mg ( Per  Tube See Alternative 04/25/21 1442)    Or  acetaminophen (TYLENOL) suppository 650 mg ( Rectal See Alternative 04/25/21 1442)  senna-docusate (Senokot-S) tablet 1 tablet (has no administration in time range)  pantoprazole (PROTONIX) injection 40 mg (has no administration in time range)  clevidipine (CLEVIPREX) infusion 0.5 mg/mL (has no administration in time range)  iohexol (OMNIPAQUE) 350 MG/ML injection 100 mL (100 mLs Intravenous Contrast Given 04/25/21 1304)  alteplase (ACTIVASE) 1 mg/mL infusion SOLN 78.3 mg (0 mg/kg  87 kg Intravenous Stopped 04/25/21 1418)    Followed by  0.9 %  sodium chloride infusion (0 mLs Intravenous Stopped 04/25/21 1506)  morphine 4 MG/ML injection 4 mg (4 mg Intravenous Given 04/25/21 1406)    ED Course  I have reviewed the triage vital signs and the nursing notes.  Pertinent labs & imaging results that were available during my care of the patient were reviewed by me and considered in my medical decision making (see chart for details).  Clinical Course as of 04/25/21 1610  Wed Apr 25, 2021  8295 CBC metabolic panel unremarkable.  Initial troponin normal, doubt ACS [JK]    Clinical Course User Index [JK] Dorie Rank, MD   MDM Rules/Calculators/A&P                          On arrival patient was seen by the stroke team, Dr. Quinn Axe.  Patient symptoms are concerning for acute stroke.  With the patient's chest pain CT angiogram of the chest was also performed to rule out acute aortic dissection.  patient's CT angiogram of the chest did not show any acute findings.  We will add on cardiac enzymes but suspect symptoms may be related to musculoskeletal chest pain.  She does have chest wall tenderness.  CT head and neck did not showed acute findings.  Patient was felt to be a tPA candidate and is currently receiving tPA.  We will continue to monitor closely.  Patient will require admission to the hospital for further treatment  Final Clinical Impression(s) / ED  Diagnoses Final diagnoses:  Cerebrovascular accident (CVA), unspecified mechanism ([place])     Dorie Rank, MD 04/25/21 1610

## 2021-04-25 NOTE — Code Documentation (Addendum)
Stroke Response Nurse Documentation Code Documentation  Melissa Olsen is a 55 y.o. female arriving to [place]. [facility] ED via [facility] on 04/25/2021. Code stroke was activated by EMS. Patient from home where she was LKW at 1115 and now complaining of right sided weakness. Patient was on a conference call when she had a sudden onset of 9/10 Chest pain and Headache. Called EMS. EMS gave 324 mg of ASA and 1 nitro. Chest pain decreased to 7/10 along with headache, but patient noted to have right sided weakness on exam. Code Stroke called.   Stroke team at the bedside on patient arrival. Labs drawn and patient cleared for CT by Dr. Tomi Bamberger. Patient to CT with team. NIHSS 3, see documentation for details and code stroke times. Patient with left facial droop, left arm weakness, and left leg weakness on exam. The following imaging was completed: CT, CTA head and neck. Patient is a candidate for tPA. Delay in tPA due to patient's chest pain and need for rule out aortic dissection.   Care/Plan: q15 NIHSS/VS x 2 hours, Reassess patient at 2 hours post-tPA. IF patient has NIHSS < 10 and no critical care needs, patient is eligible for OPTIMISTmain trial.   Bedside handoff with ED RN Kathlee Nations.    Kathrin Greathouse  Stroke Response RN

## 2021-04-25 NOTE — H&P (Addendum)
NEUROLOGY H&P   Date of service: April 25, 2021 Patient Name: Melissa Olsen MRN:  [id number] DOB:  05-May-1966  Chief complaint: R sided weakness _ _ _   _ __   _ __ _ _  __ __   _ __   __ _  History of Present Illness   Melissa Olsen is a 55 y.o. female without significant pmhx who was BIB EMS for 9/10 chest pain and R sided weakness. LKW 1115 today. She was on a conference call and developed acute central chest pain, sharp, worse with breathing. She subsequently reported R sided weakness. Stroke code was activated by EMS. On arrival NIHSS = 4 for facial palsy, 1 RUE drift, 2 RLE drift. Over course of stroke code RLE drift improved to a 1. CT head NAICP. Due to chest pain there was concern for aortic dissection and CTA chest was performed to rule this out prior to administering tPA (causing necessary delay in tPA administration). CTA chest was negative for aortic dissection and showed no other significant findings. Risks and benefits were discussed with patient who gave informed consent to proceed with tPA. CTA H&N showed no LVO. All CNS imaging was personally reviewed and discussed by phone w/ radiology.  After stroke code patient reported worsening of chest pain and received morphine. Chest pain reproducible on palpation and favored to be chest wall pain but high sensitivity troponins were ordered.    ROS   Per HPI; all other systems reviewed and are negative  Past History   History reviewed. No pertinent past medical history. History reviewed. No pertinent surgical history. History reviewed. No pertinent family history. Social History   Socioeconomic History  . Marital status: Unknown    Spouse name: Not on file  . Number of children: Not on file  . Years of education: Not on file  . Highest education level: Not on file  Occupational History  . Not on file  Tobacco Use  . Smoking status: Not on file  . Smokeless tobacco: Not on file  Substance and Sexual Activity  . Alcohol  use: Not on file  . Drug use: Not on file  . Sexual activity: Not on file  Other Topics Concern  . Not on file  Social History Narrative  . Not on file   Social Determinants of Health   Financial Resource Strain: Not on file  Food Insecurity: Not on file  Transportation Needs: Not on file  Physical Activity: Not on file  Stress: Not on file  Social Connections: Not on file   Not on File  Medications   (Not in a hospital admission)    Vitals   Vitals:   04/25/21 1330 04/25/21 1345 04/25/21 1400 04/25/21 1406  BP: 134/71 (!) 142/91 131/71 131/71  Pulse: 81 70 72 93  Resp: 17 12 17 18   Temp:      TempSrc:      SpO2: 97% 100%  97%  Weight:      Height:  5\' 8"  (1.727 m)       Body mass index is 29.16 kg/m.  Physical Exam   Physical Exam Gen: A&O x4, NAD HEENT: Atraumatic, normocephalic;mucous membranes moist; oropharynx clear, tongue without atrophy or fasciculations. Neck: Supple, trachea midline. Resp: CTAB, no w/r/r CV: RRR, no m/g/r; nml S1 and S2. 2+ symmetric peripheral pulses. Abd: soft/NT/ND; nabs x 4 quad Extrem: Nml bulk; no cyanosis, clubbing, or edema.  Neuro: *MS: A&O x4. Follows multi-step commands.  *Speech: fluid, nondysarthric,  able to name and repeat *CN:    I: Deferred   II,III: PERRLA, VFF by confrontation, optic discs not visualized 2/2 pupillary constriction   III,IV,VI: EOMI w/o nystagmus, no ptosis   V: Sensation intact from V1 to V3 to LT   VII: Eyelid closure was full.  R UMN facial droop   VIII: Hearing intact to voice   IX,X: Voice normal, palate elevates symmetrically    XI: SCM/trap 5/5 bilat   XII: Tongue protrudes midline, no atrophy or fasciculations  *Motor:   Normal bulk.  No tremor, rigidity or bradykinesia. LUE and LLE full strength. RUE drift and decreased grip strength 4/5. RLE initially only some movement against gravity but improved to only drift with 3/5 HF strength. *Sensory: Intact to light touch, pinprick,  temperature vibration throughout. Symmetric. Propioception intact bilat.  No double-simultaneous extinction.  *Coordination:  Finger-to-nose, heel-to-shin, rapid alternating motions were intact. *Reflexes:  2+ and symmetric throughout without clonus; toes down-going bilat *Gait: deferred  On arrival NIHSS = 4 for facial palsy, 1 RUE drift, 2 RLE drift. Over course of stroke code RLE drift improved to a 1.    Premorbid mRS = 0   Labs   CBC:  Recent Labs  Lab 04/25/21 1234 04/25/21 1235  WBC 5.7  --   NEUTROABS 3.0  --   HGB 12.6 12.9  HCT 38.2 38.0  MCV 86.6  --   PLT 295  --     Basic Metabolic Panel:  Lab Results  Component Value Date   NA 142 04/25/2021   K 3.7 04/25/2021   CO2 25 04/25/2021   GLUCOSE 115 (H) 04/25/2021   BUN 13 04/25/2021   CREATININE 0.80 04/25/2021   CALCIUM 9.2 04/25/2021   GFRNONAA >60 04/25/2021   Lipid Panel: No results found for: LDLCALC HgbA1c: No results found for: HGBA1C Urine Drug Screen: No results found for: LABOPIA, COCAINSCRNUR, LABBENZ, AMPHETMU, THCU, LABBARB  Alcohol Level No results found for: ETH    Impression   Melissa Olsen is a 55 y.o. female without significant pmhx who was BIB EMS for 9/10 chest pain and R sided weakness. LKW 1115 today. S/p tPA. No LVO. CP favored to be chest wall pain 2/2 reproducible on palpation. EKG unremarkable. Troponins pending.  Recommendations   # R sided weakness c/f acute ischemic infarct - Admit to [facility] ICU under Dr. Quinn Axe - Routine post Thrombolytic monitoring including neuro checks and blood pressure control during/after treatment Monitor blood pressure Check blood pressure and neuro assessment every 15 min for 2 h, then every 30 min for 6 h, and finally every hour for 16 h. - Manage Blood Pressure per post Thrombolytic protocol. Avoid oral antihypertensives - CT brain 24 hours post Thrombolytic - NPO until swallowing screen performed and passed - No antiplatelet agents or  anticoagulants (including heparin for DVT prophylaxis) in first 24 hours - No Foley catheter, nasogastric tube, arterial catheter or central venous catheter for 24 hr, unless absolutely necessary - Telemetry - MRI brain wo - TTE w/ bubble - Check A1c and LDL + add statin per guidelines - q4 hr neuro checks - STAT head CT for any change in neuro exam - PT/OT - Stroke education - Amb referral to neurology upon discharge  # Chest pain - Trend troponins   Stroke team will assume care tomorrow.  ______________________________________________________________________  This patient is critically ill and at significant risk of neurological worsening, death and care requires constant monitoring of vital signs, hemodynamics,respiratory and  cardiac monitoring, neurological assessment, discussion with family, other specialists and medical decision making of high complexity. I spent 90 minutes of neurocritical care time  in the care of  this patient. This was time spent independent of any time provided by nurse practitioner or PA.  Su Monks, MD [facility] [phone]  If 7pm- 7am, please page neurology on call as listed in [place].   Su Monks, MD [facility] [phone]  If 7pm- 7am, please page neurology on call as listed in [place].

## 2021-04-25 NOTE — ED Notes (Addendum)
Called into room by daughter at [facility] for pt c/o CP, "severe and unchanged from previous". Pt lying flat, alert, NAD, calm, interactive, resps e/u, speaking in clear complete sentences. VSS. Labs and imaging reviewed. Admitting notified.

## 2021-04-25 NOTE — ED Notes (Signed)
Chest pain unresolved. MD made aware

## 2021-04-25 NOTE — Research (Signed)
Patient meets criteria for OPTIMISTmain trial. Patient was given tPA with no needs for BP management or critical care needs. NIHSS 1 at 2 hour post-tPA.   Patient Information Sheet given to patient and left at bedside. Reviewed with patient and patient has agreed to data collection, discharged questions, and 90 day follow-up. All questions answered.   Kathrin Greathouse RN BSN SCRN [facility] Stroke Response Coordinator

## 2021-04-25 NOTE — ED Notes (Signed)
Patient transported to MRI 

## 2021-04-25 NOTE — Progress Notes (Addendum)
PHARMACIST CODE STROKE RESPONSE  Notified to mix tPA at 1313 by Dr. Quinn Axe Delivered tPA to RN at 1315.   tPA dose = 7.8 mg bolus over 1 minute followed by 70.5 mg for a total dose of 78.3 mg over 1 hour  Issues/delays encountered (if applicable): Patient arrived with CP as well. Waited for CT dissection rule out. No BP issues.  Joetta Manners, PharmD, [facility] Clinical Pharmacist ED RPh Phone: [place]: [phone]

## 2021-04-26 ENCOUNTER — Inpatient Hospital Stay (HOSPITAL_COMMUNITY): Payer: BC Managed Care – PPO

## 2021-04-26 DIAGNOSIS — I6389 Other cerebral infarction: Secondary | ICD-10-CM

## 2021-04-26 DIAGNOSIS — I6300 Cerebral infarction due to thrombosis of unspecified precerebral artery: Secondary | ICD-10-CM

## 2021-04-26 LAB — LIPID PANEL
Cholesterol: 193 mg/dL (ref 0–200)
HDL: 57 mg/dL (ref >40)
LDL Cholesterol: 115 mg/dL — ABNORMAL HIGH (ref 0–99)
Total CHOL/HDL Ratio: 3.4 RATIO
Triglycerides: 103 mg/dL (ref <150)
VLDL: 21 mg/dL (ref 0–40)

## 2021-04-26 LAB — ECHOCARDIOGRAM COMPLETE
Area-P 1/2: 2.8 cm2
Height: 68 in
S' Lateral: 3.2 cm
Weight: 3068.8 oz

## 2021-04-26 LAB — MRSA NEXT GEN BY PCR, NASAL: MRSA by PCR Next Gen: NOT DETECTED

## 2021-04-26 LAB — HEMOGLOBIN A1C
Hgb A1c MFr Bld: 6.6 % — ABNORMAL HIGH (ref 4.8–5.6)
Mean Plasma Glucose: 142.72 mg/dL

## 2021-04-26 IMAGING — CT CT HEAD W/O CM
4 series · 15 of 47 positions shown, 17 images · non-contrast
Comparison: CT and MRI studies yesterday.

CLINICAL DATA: Neurological deficit, acute, stroke suspected. TPA
follow-up.

EXAM:
CT HEAD WITHOUT CONTRAST
TECHNIQUE: Contiguous axial images were obtained from the base of the skull
through the vertex without intravenous contrast.

[Series 2: head wo · axial · 0.32mm/px · z∈[-128,-10]mm · 7 of 32 slices shown, 9 images]
[im 4/32  brain]
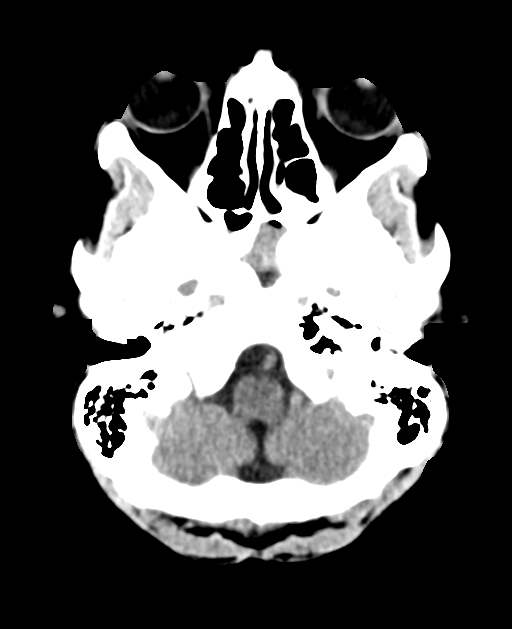
[im 4/32  bone]
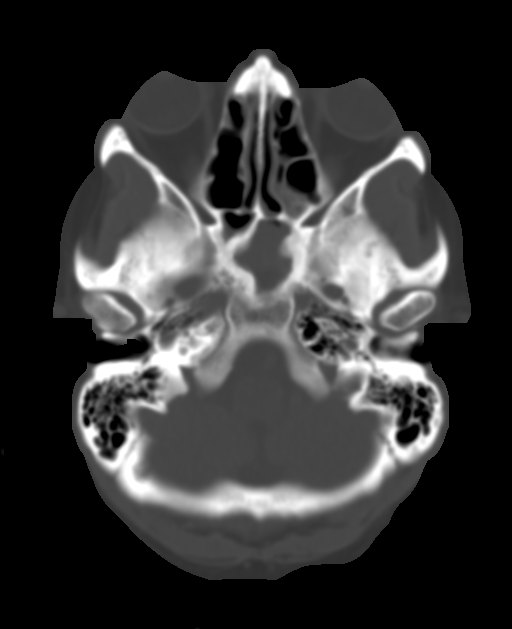
[im 8/32  brain]
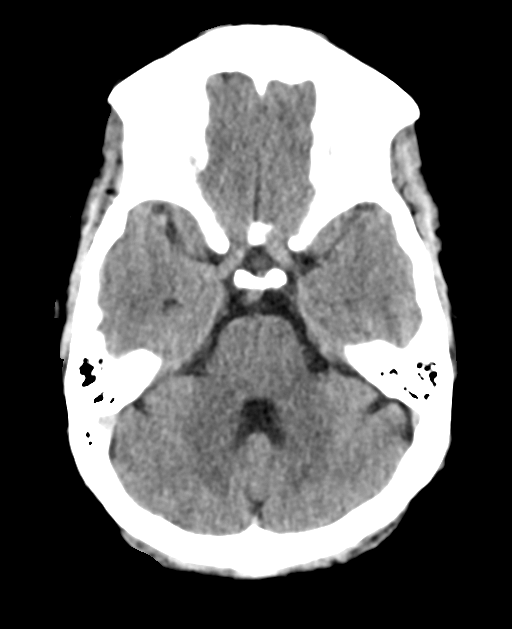
[im 12/32  brain]
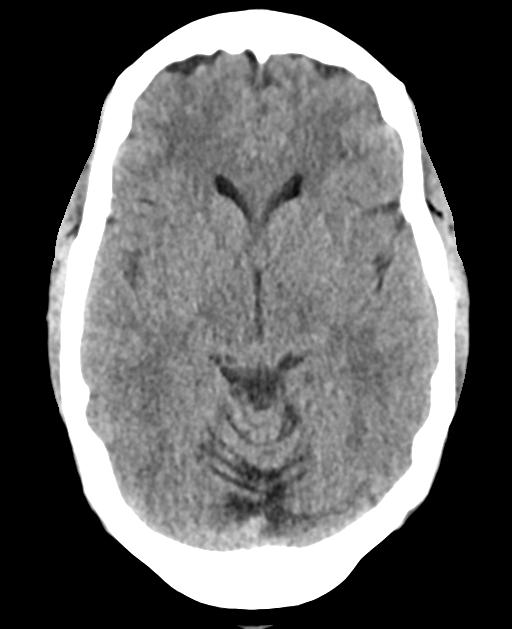
[im 16/32  brain]
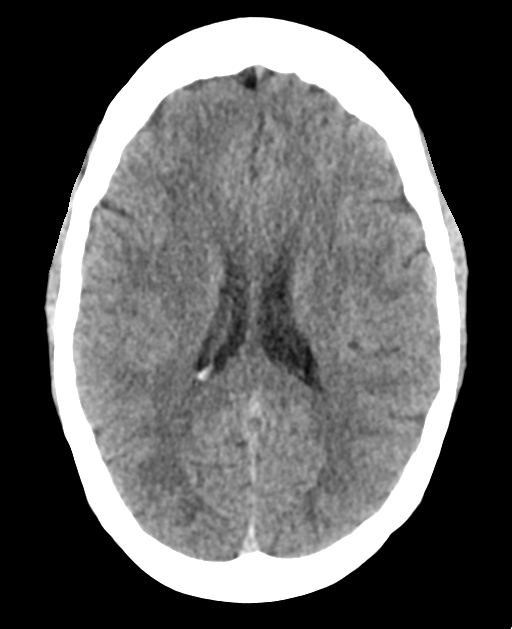
[im 20/32  brain]
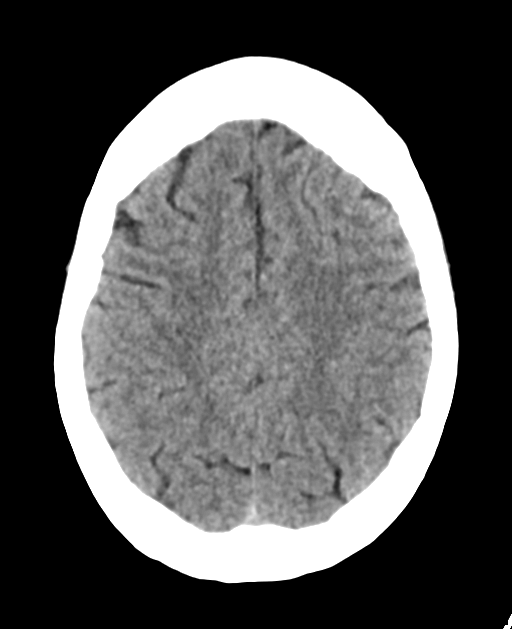
[im 20/32  bone]
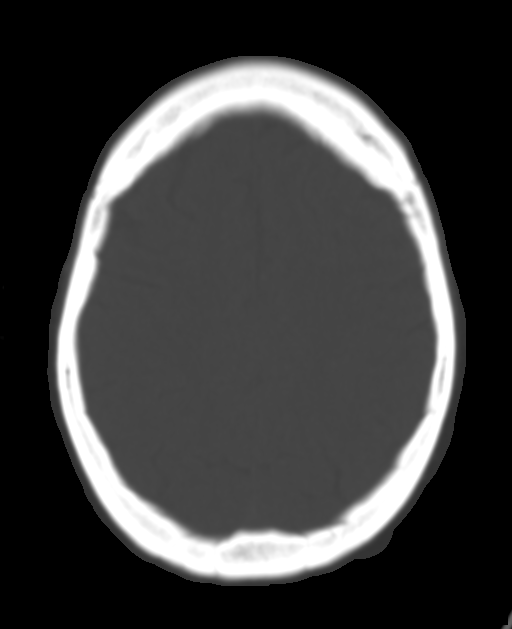
[im 24/32  brain]
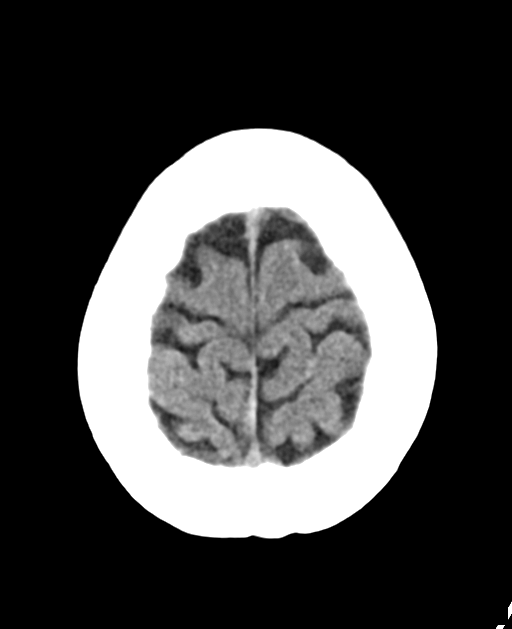
[im 28/32  brain]
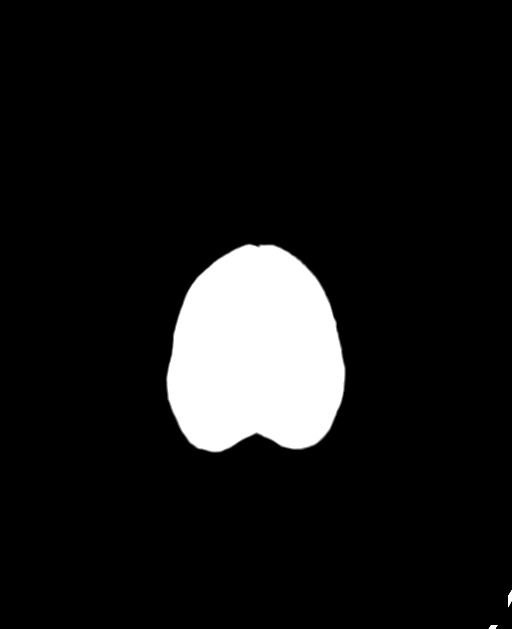

[Series 3: head bone · axial · 0.42mm/px · z∈[-124,-108]mm · 2 of 76 slices shown]
[im 8/76  bone]
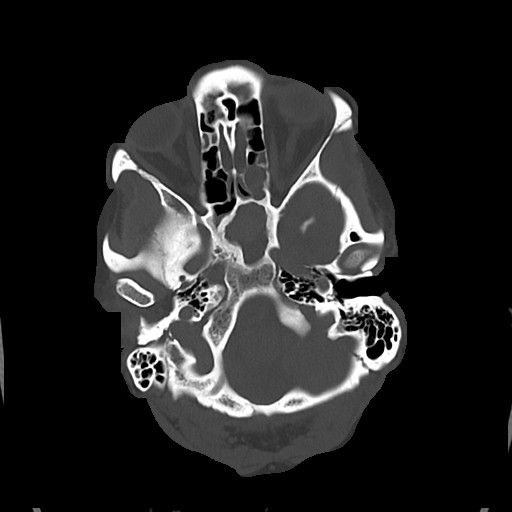
[im 16/76  bone]
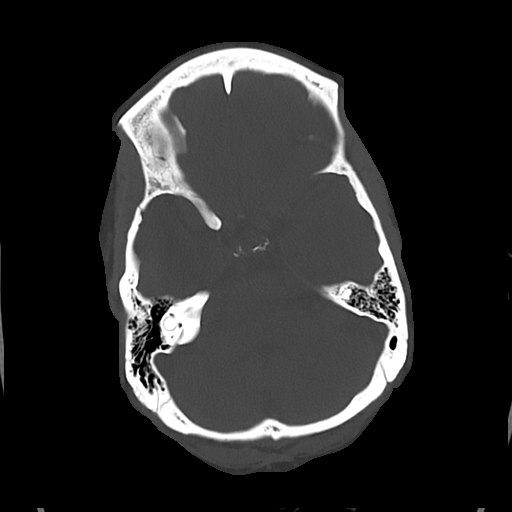

[Series 4: cor soft · coronal · 0.31mm/px · 3 of 67 slices shown]
[im 23/67  brain]
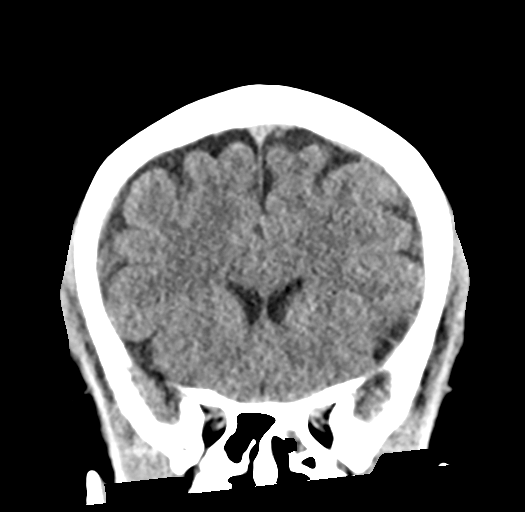
[im 30/67  brain]
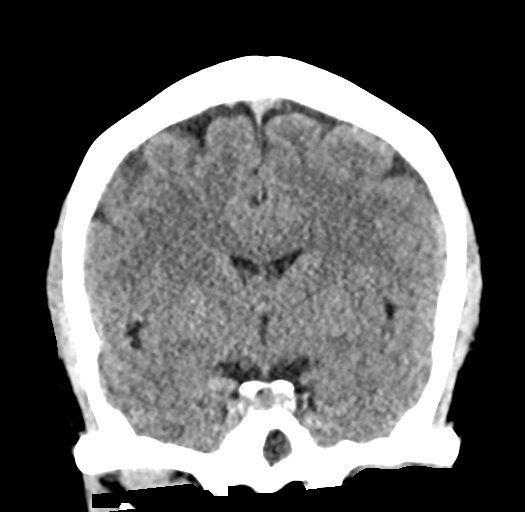
[im 37/67  brain]
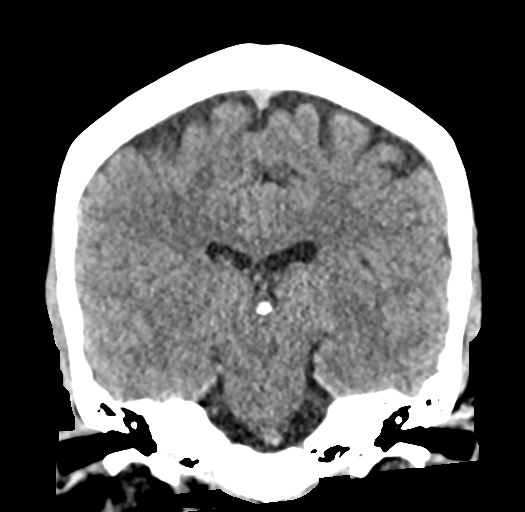

[Series 5: sag soft · sagittal · 0.31mm/px · 3 of 55 slices shown]
[im 19/55  brain]
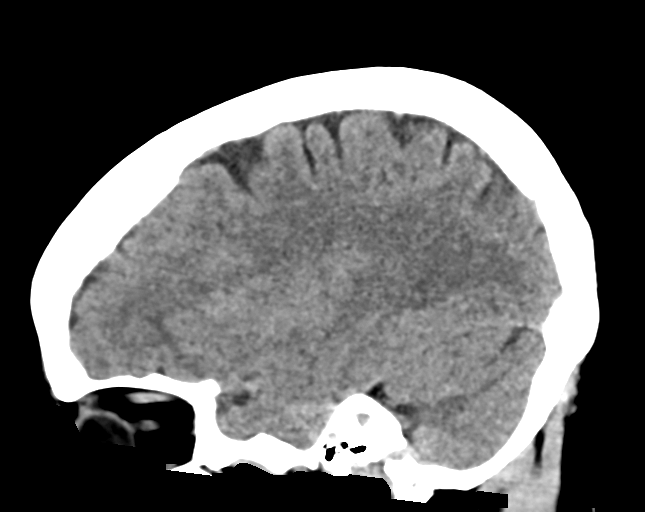
[im 28/55  brain]
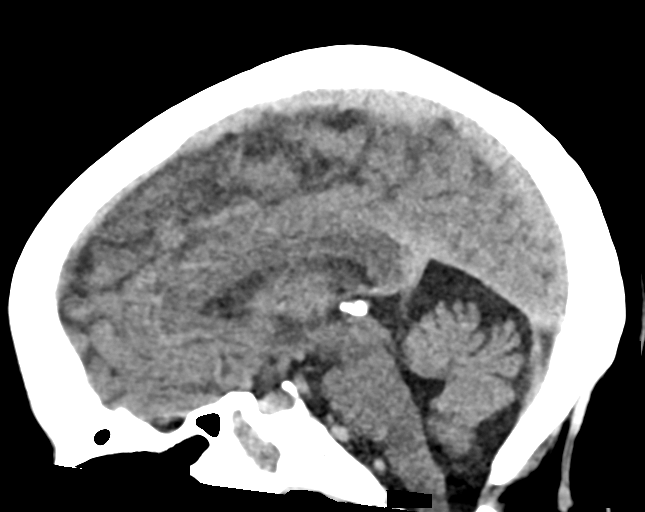
[im 37/55  brain]
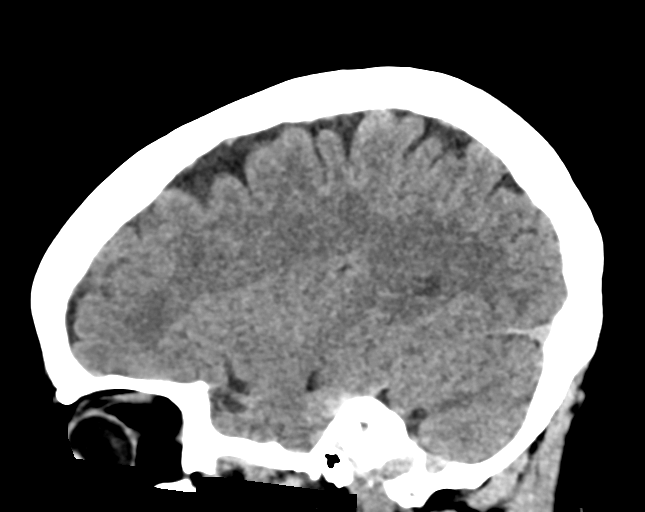

[15 of 47 positions shown; findings below may reference images not displayed]

FINDINGS: Brain: The brain shows a normal appearance without evidence of
malformation, atrophy, old or acute small or large vessel
infarction, mass lesion, hemorrhage, hydrocephalus or extra-axial
collection.

Vascular: No hyperdense vessel. No evidence of atherosclerotic
calcification.

Skull: Normal.  No traumatic finding.  No focal bone lesion.

Sinuses/Orbits: Previous functional endoscopic sinus surgery.
Mucosal thickening in the ethmoid sinus regions. Opacification of
the frontal and sphenoid sinuses. Orbits negative.

Other: None significant
IMPRESSION: Normal appearance of the brain itself. No sign of stroke or
hemorrhage.

Previous functional endoscopic sinus surgery. Ongoing sinus
inflammatory disease as above.

## 2021-04-26 MED ORDER — METFORMIN HCL 500 MG PO TABS
500.0000 mg | ORAL_TABLET | Freq: Every morning | ORAL | Status: DC
  Administered 2021-04-27: 500 mg via ORAL
  Filled 2021-04-26: qty 1

## 2021-04-26 MED ORDER — IBUPROFEN 200 MG PO TABS
600.0000 mg | ORAL_TABLET | Freq: Four times a day (QID) | ORAL | Status: DC | PRN
  Administered 2021-04-26: 600 mg via ORAL
  Filled 2021-04-26: qty 3

## 2021-04-26 MED ORDER — ROSUVASTATIN CALCIUM 20 MG PO TABS
20.0000 mg | ORAL_TABLET | Freq: Every day | ORAL | Status: DC
  Administered 2021-04-26 – 2021-04-27 (×2): 20 mg via ORAL
  Filled 2021-04-26 (×2): qty 1

## 2021-04-26 NOTE — Progress Notes (Addendum)
STROKE TEAM PROGRESS NOTE   INTERVAL HISTORY  No significant overnight events.  Significant other at bedside. Feels that her presenting neurologic symptoms have resolved.  Discussed the importance of risk factor mitigation and to discontinue marijuana use.   Continues to have chest discomfort that is worse with laughing or moving around. She notes that the pain on presentation was initially substernal however this has resolved. Pain is primarily located in the right upper chest/shoulder area.   CBC:  Recent Labs  Lab 04/25/21 1234 04/25/21 1235  WBC 5.7  --   NEUTROABS 3.0  --   HGB 12.6 12.9  HCT 38.2 38.0  MCV 86.6  --   PLT 295  --    Basic Metabolic Panel:  Recent Labs  Lab 04/25/21 1234 04/25/21 1235  NA 140 142  K 3.5 3.7  CL 107 105  CO2 25  --   GLUCOSE 116* 115*  BUN 10 13  CREATININE 0.80 0.80  CALCIUM 9.2  --    Lipid Panel:  Recent Labs  Lab 04/26/21 0912  CHOL 193  TRIG 103  HDL 57  CHOLHDL 3.4  VLDL 21  LDLCALC 115*   HgbA1c:  Recent Labs  Lab 04/26/21 0912  HGBA1C 6.6*   Urine Drug Screen:  Recent Labs  Lab 04/25/21 1826  LABOPIA POSITIVE*  COCAINSCRNUR NONE DETECTED  LABBENZ NONE DETECTED  AMPHETMU NONE DETECTED  THCU POSITIVE*  LABBARB NONE DETECTED    Head CT w/o contrast: no acute infarct or bleed  CTA head/neck: no LVO, vessels patent  MRI brain: no acute findings   PHYSICAL EXAM General: well appearing, in no acute distress Cardiac: RRR Pulm: breathing comfortably on room air. Lungs clear MSK: pain with palpation of the right upper chest wall Neurologic exam Mental status: a/o x4.  Speech: normal pattern and articulation Cranial nerves: EOMs, peripheral vision intact. No facial droop.  Motor: 5/5 strength in the bilateral upper and lower extremities. Poor and variable effort on strength testing in lower extremities. Sensation: intact throughout Normal finger to nose testing.  NIHSS 0 ASSESSMENT/PLAN Ms.  Melissa Olsen is a 55 y.o. female with diabetes who presented via EMS for acute onset chest pain and right sided weakness. She received tPA. Head imaging has been negative for acute infarct.   Stroke like episode s/p tPA.  CT head w/o contrast: no infarct or hemmorhage CTA head/neck: no LVO; vessels patent MRI brain: no acute findings.  2D Echo pending LDL 115 HgbA1c 6.6 Start aspirin 81mg  daily Therapy recommendations:  outpatient PT Disposition:  stable for transfer out to the floor today. Will be able to discharge once workup is complete.   Blood pressure control Blood pressure slightly elevated at this time but within goal for the admission. Will need to follow up with a PCP for ongoing monitoring after discharge.  Blood pressure goal is normotensive  Hyperlipidemia LDL 115, goal < 70 Start crestor 20mg  daily  Diabetes type II  HgbA1c 6.6, goal < 7.0 Resume home metformin Will need outpatient follow up with PCP for ongoing monitoring  Other Stroke Risk Factors Marijuana use--encourage cessation Class 1 Obesity, Body mass index is 29.16 kg/m., BMI >/= 30 associated with increased stroke risk, recommend weight loss, diet and exercise as appropriate   Costochondritis.  -prn tylenol and ibuprofen  Hospital day # [place], MD Internal Medicine Resident PGY-3 Melissa Olsen Internal Medicine Residency Pager: [phone] 04/26/2021 1:06 PM    STROKE MD NOTE : I have  personally obtained history,examined this patient, reviewed notes, independently viewed imaging studies, participated in medical decision making and plan of care.ROS completed by me personally and pertinent positives fully documented  I have made any additions or clarifications directly to the above note. Agree with note above. She presented with noncardiac chest pain and stroke like symptoms and received iv TPA and has improved. Continue strict BP control and close neurological checks as per post TPA  protocol. Mobilize out of bed and therapy consults. Check Echo. Patient counselled to quit using marijuana. This patient is critically ill and at significant risk of neurological worsening, death and care requires constant monitoring of vital signs, hemodynamics,respiratory and cardiac monitoring, extensive review of multiple databases, frequent neurological assessment, discussion with family, other specialists and medical decision making of high complexity.I have made any additions or clarifications directly to the above note.This critical care time does not reflect procedure time, or teaching time or supervisory time of PA/NP/Med Resident etc but could involve care discussion time.  I spent 30 minutes of neurocritical care time  in the care of  this patient.      Melissa Contras, MD Medical Director [place] Pager: [phone] 04/26/2021 4:56 PM   To contact Stroke Continuity provider, please refer to [email or web address] After hours, contact General Neurology

## 2021-04-26 NOTE — Evaluation (Signed)
Occupational Therapy Evaluation Patient Details Name: Melissa Olsen MRN: [id number] DOB: 1966-02-07 Today's Date: 04/26/2021    History of Present Illness Pt is a 55 y.o. female who presented 04/25/21 with chest pain and R sided weakness. NIHSS = 4. tPA administered. Imaging of chest negative for aortic aneurysm or dissection or PE. Brain MRI normal. No significant PMH.   Clinical Impression   Patient evaluated by Occupational Therapy with no further acute OT needs identified. All education has been completed and the patient has no further questions. Pt is able to complete ADLs mod I.  She demonstrates mild balance deficits.  Discussed possibility of using a shower seat at home.   See below for any follow-up Occupational Therapy or equipment needs. OT is signing off. Thank you for this referral.     Follow Up Recommendations  No OT follow up    Equipment Recommendations  None recommended by OT    Recommendations for Other Services       Precautions / Restrictions Precautions Precautions: Fall      Mobility Bed Mobility               General bed mobility comments: Pt sitting up in recliner    Transfers                      Balance Overall balance assessment: Needs assistance Sitting-balance support: No upper extremity supported;Feet supported Sitting balance-Leahy Scale: Normal Sitting balance - Comments: Able to reach off BOS to donn socks safely on EOB.   Standing balance support: No upper extremity supported Standing balance-Leahy Scale: Fair Standing balance comment: able to maintain static standing mod I                           ADL either performed or assessed with clinical judgement   ADL Overall ADL's : Modified independent                                       General ADL Comments: Pt able to complete ADLs mod I.  She does require UE support to access feet in standing.  Did simulate shower in standing with using Rt  UE for support while simulating bathing feet.  Discussed use of tub seat with pt if she feels off balance.  She verbalized understanding     Vision Baseline Vision/History: No visual deficits Patient Visual Report: No change from baseline Vision Assessment?: No apparent visual deficits     Perception Perception Perception Tested?: Yes   Praxis Praxis Praxis tested?: Within functional limits    Pertinent Vitals/Pain Pain Assessment: 0-10 Pain Score: 10-Worst pain ever Pain Location: R chest and back Pain Descriptors / Indicators: Aching;Sharp Pain Intervention(s): Monitored during session (RN aware)     Hand Dominance Right   Extremity/Trunk Assessment Upper Extremity Assessment Upper Extremity Assessment: RUE deficits/detail RUE Deficits / Details: strength 5/5.  Pt able to manipulate items in hand without difficulty RUE Sensation: WNL RUE Coordination: WNL       Cervical / Trunk Assessment Cervical / Trunk Assessment: Normal   Communication Communication Communication: No difficulties   Cognition Arousal/Alertness: Awake/alert Behavior During Therapy: WFL for tasks assessed/performed Overall Cognitive Status: Within Functional Limits for tasks assessed  General Comments  VSS    Exercises     Shoulder Instructions      Home Living Family/patient expects to be discharged to:: Private residence Living Arrangements: Alone;Children (daughter home from college currently) Available Help at Discharge: Family;Available 24 hours/day Type of Home: House Home Access: Level entry     Home Layout: One level     Bathroom Shower/Tub: Occupational psychologist: Standard     Home Equipment: None          Prior Functioning/Environment Level of Independence: Independent        Comments: Pt works 2 jobs, 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 Pt drives.        OT Problem List: Impaired balance (sitting and/or standing)      OT Treatment/Interventions:      OT Goals(Current goals can be found in the care plan section) Acute Rehab OT Goals Patient Stated Goal: to get back to normal OT Goal Formulation: All assessment and education complete, DC therapy  OT Frequency:     Barriers to D/C:            Co-evaluation              AM-PAC OT "6 Clicks" Daily Activity     Outcome Measure Help from another person eating meals?: None Help from another person taking care of personal grooming?: None Help from another person toileting, which includes using toliet, bedpan, or urinal?: None Help from another person bathing (including washing, rinsing, drying)?: None Help from another person to put on and taking off regular upper body clothing?: None Help from another person to put on and taking off regular lower body clothing?: None 6 Click Score: 24   End of Session Nurse Communication: Mobility status  Activity Tolerance: Patient tolerated treatment well Patient left: Other (comment) (transport chair to radiology)  OT Visit Diagnosis: Unsteadiness on feet (R26.81)                Time: 0258-5277 OT Time Calculation (min): 14 min Charges:  OT General Charges $OT Visit: 1 Visit OT Evaluation $OT Eval Low Complexity: 1 Low  Nilsa Nutting., OTR/L Acute Rehabilitation Services Pager [phone] Office [place], [place] 04/26/2021, 4:20 PM

## 2021-04-26 NOTE — Progress Notes (Signed)
  Echocardiogram 2D Echocardiogram has been performed.  Randa Lynn Mahi Zabriskie 04/26/2021, 4:29 PM

## 2021-04-26 NOTE — Evaluation (Signed)
Speech Language Pathology Evaluation Patient Details Name: Melissa Olsen MRN: [id number] DOB: 1966/07/01 Today's Date: 04/26/2021 Time: 0712-1975 SLP Time Calculation (min) (ACUTE ONLY): 17 min  Problem List:  Patient Active Problem List   Diagnosis Date Noted   Stroke [facility]) 04/25/2021   Chest pain    Past Medical History: History reviewed. No pertinent past medical history. Past Surgical History: History reviewed. No pertinent surgical history. HPI:  55 year old female admitted with right sided weakness and chest pain concerning for acute infarct, s/p tpa. MRI negative.   Assessment / Plan / Recommendation Clinical Impression  Cognitive-linguistic evaluation complete with patient performing [facility] for all tasks assessed. No SLP f/u indicated.    SLP Assessment  SLP Recommendation/Assessment: Patient does not need any further Speech Lanaguage Pathology Services SLP Visit Diagnosis: Cognitive communication deficit (R41.841)             SLP Evaluation Cognition  Overall Cognitive Status: Within Functional Limits for tasks assessed       Comprehension  Auditory Comprehension Overall Auditory Comprehension: Appears within functional limits for tasks assessed Visual Recognition/Discrimination Discrimination: Within Function Limits Reading Comprehension Reading Status: Within funtional limits    Expression Expression Primary Mode of Expression: Verbal Verbal Expression Overall Verbal Expression: Appears within functional limits for tasks assessed   Oral / Motor  Motor Speech Overall Motor Speech: Appears within functional limits for tasks assessed   GO            Gabriel Rainwater MA, CCC-SLP         [name] Meryl 04/26/2021, 8:23 AM

## 2021-04-26 NOTE — Evaluation (Signed)
Physical Therapy Evaluation Patient Details Name: Melissa Olsen MRN: [id number] DOB: Sep 02, 1966 Today's Date: 04/26/2021   History of Present Illness  Pt is a 55 y.o. female who presented 04/25/21 with chest pain and R sided weakness. NIHSS = 4. tPA administered. Imaging of chest negative for aortic aneurysm or dissection or PE. Brain MRI normal. No significant PMH.   Clinical Impression  Pt presents with condition above and deficits mentioned below, see PT Problem List. 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 She was living alone (daughter home from college currently) in a 1-level house with a level entry. Currently, pt displays R lower extremity weakness, R lower extremity disdiadochokinesia and dysmetria, balance deficits, decreased activity tolerance, and slight R UE weakness compared to L. Pt is needing min guard assist for transfers and min guard assist-minA to ambulate short household distances slowly with a RW as she displays R knee buckling that places her at risk for falls. Expect her to make good progress as her frequency of knee buckling decreased as this session progressed. Recommending follow-up with Outpatient PT and OT, preferably at a neuro specialty clinic if able. Will continue to follow acutely.    Follow Up Recommendations Outpatient PT;Supervision for mobility/OOB (neuro specialty if able)    Equipment Recommendations  Rolling walker with 5" wheels;Other (comment) (shower chair; pending progression)    Recommendations for Other Services Other (comment) (Outpatient OT)     Precautions / Restrictions Precautions Precautions: Fall Restrictions Weight Bearing Restrictions: No      Mobility  Bed Mobility Overal bed mobility: Independent             General bed mobility comments: Pt able to transition supine > sit R EOB with bed flat without use of rails  safely, but with extra time.    Transfers Overall transfer level: Needs assistance Equipment used: None;Rolling walker (2 wheeled) Transfers: Sit to/from Stand Sit to Stand: Min guard         General transfer comment: Sit to stand from EOB 2x, needing extra time to power up to stand and min guard assist for safety due to mild instability but no LOB noted. Cues provided for hand placement when using RW.  Ambulation/Gait Ambulation/Gait assistance: Min assist;Min guard Gait Distance (Feet): 105 Feet Assistive device: Rolling walker (2 wheeled);None Gait Pattern/deviations: Step-through pattern;Narrow base of support;Decreased weight shift to right;Decreased stride length;Drifts right/left (drifts R) Gait velocity: reduced Gait velocity interpretation: <1.8 ft/sec, indicate of risk for recurrent falls General Gait Details: Initially tried to ambulate without UE support with pt displaying R knee buckle, needing minA to recover balance and return to sit to obtain RW. Cued pt to shift weight laterally and focus on R quads activation during stance to improve knee stability, progressed to marching, and then progressed to ambulating in room and down part of hall, displaying decreased frequency of buckling but still knee instability as distance progressed, minA > min guard assist.  Stairs            Wheelchair Mobility    Modified Rankin (Stroke Patients Only) Modified Rankin (Stroke Patients Only) Pre-Morbid Rankin Score: No symptoms Modified Rankin: Moderately severe disability     Balance Overall balance assessment: Needs assistance Sitting-balance support: No upper extremity supported;Feet supported Sitting balance-Leahy Scale: Normal Sitting balance - Comments: Able to reach off BOS to donn socks safely on EOB.   Standing  balance support: Bilateral upper extremity supported;During functional activity Standing balance-Leahy Scale: Poor Standing balance comment: Reliant on UE  support for stability with mobility.                             Pertinent Vitals/Pain Pain Assessment: 0-10 Pain Score: 8  Pain Location: R chest and back Pain Descriptors / Indicators: Aching Pain Intervention(s): Limited activity within patient's tolerance;Monitored during session;Repositioned;Premedicated before session    Home Living Family/patient expects to be discharged to:: Private residence Living Arrangements: Alone;Children (daughter home from college currently) Available Help at Discharge: Family;Available 24 hours/day Type of Home: House Home Access: Level entry     Home Layout: One level Home Equipment: None      Prior Function Level of Independence: Independent         Comments: Pt works 2 jobs, 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 Pt drives.     Hand Dominance   Dominant Hand: Right    Extremity/Trunk Assessment   Upper Extremity Assessment Upper Extremity Assessment: RUE deficits/detail RUE Deficits / Details: Minimal strength deficits compared to L side, scoring 4+ to 5 grossly on R and 5 grossly on L; slightly decreased grip strength on R compared to L even though pt is R-handed; denies numbness/tingling; no dysdiadochokinesia or dysmetria noted, coordination intact RUE Sensation: WNL RUE Coordination: WNL    Lower Extremity Assessment Lower Extremity Assessment: RLE deficits/detail RLE Deficits / Details: Decreased strength compared to L, grossly 4- to 4 on R and 5 on L; denies numbness/tingling; displays disdiadochokenisia and dysmetria RLE Sensation: decreased proprioception RLE Coordination: decreased fine motor;decreased gross motor (dysdiadochokinesia and dysmetria noted)    Cervical / Trunk Assessment Cervical / Trunk Assessment: Normal  Communication   Communication: No difficulties  Cognition  Arousal/Alertness: Awake/alert Behavior During Therapy: WFL for tasks assessed/performed Overall Cognitive Status: Within Functional Limits for tasks assessed                                 General Comments: A&Ox4.      General Comments General comments (skin integrity, edema, etc.): VSS on RA throughout, including BP as pt did note some lightheadedness initially upon sitting up which resolved with time    Exercises     Assessment/Plan    PT Assessment Patient needs continued PT services  PT Problem List Decreased strength;Decreased activity tolerance;Decreased balance;Decreased mobility;Decreased coordination;Decreased knowledge of use of DME;Decreased safety awareness       PT Treatment Interventions DME instruction;Gait training;Stair training;Functional mobility training;Therapeutic activities;Therapeutic exercise;Balance training;Neuromuscular re-education;Patient/family education    PT Goals (Current goals can be found in the Care Plan section)  Acute Rehab PT Goals Patient Stated Goal: to improve PT Goal Formulation: With patient Time For Goal Achievement: 05/10/21 Potential to Achieve Goals: Good    Frequency Min 4X/week   Barriers to discharge        Co-evaluation               AM-PAC PT "6 Clicks" Mobility  Outcome Measure Help needed turning from your back to your side while in a flat bed without using bedrails?: None Help needed moving from lying on your back to sitting on the side of a flat bed without using bedrails?: None Help needed moving to  and from a bed to a chair (including a wheelchair)?: A Little Help needed standing up from a chair using your arms (e.g., wheelchair or bedside chair)?: A Little Help needed to walk in hospital room?: A Little Help needed climbing 3-5 steps with a railing? : A Lot 6 Click Score: 19    End of Session Equipment Utilized During Treatment: Gait belt Activity Tolerance: Patient tolerated treatment  well Patient left: in chair;with call bell/phone within reach;with chair alarm set Nurse Communication: Mobility status PT Visit Diagnosis: Unsteadiness on feet (R26.81);Other abnormalities of gait and mobility (R26.89);Muscle weakness (generalized) (M62.81);Difficulty in walking, not elsewhere classified (R26.2);Other symptoms and signs involving the nervous system (R29.898)    Time: 8453-6468 PT Time Calculation (min) (ACUTE ONLY): 38 min   Charges:   PT Evaluation $PT Eval Moderate Complexity: 1 Mod PT Treatments $Gait Training: 8-22 mins $Therapeutic Activity: 8-22 mins        Moishe Spice, PT, DPT Acute Rehabilitation Services  Pager: [phone] Office: [phone]   Orvan Falconer 04/26/2021, 11:25 AM

## 2021-04-26 NOTE — TOC CAGE-AID Note (Signed)
Transition of Care [facility]) - CAGE-AID Screening   Patient Details  Name: Melissa Olsen MRN: [id number] Date of Birth: 09-01-1966  Transition of Care [facility]) CM/SW Contact:    Kaleya Douse C Tarpley-Carter, LCSWA Phone Number: 04/26/2021, 1:02 PM   Clinical Narrative: Pt participated in [place].  Pt did not deny substance or ETOH use. Pt was offered resources.  Pt stated they did not feel that they were in need of resources at this time. CSW will attach educational resources to dc summary for possible future use.  Novice Vrba Tarpley-Carter, MSW, LCSW-A Pronouns:  She/Her/Hers [facility]itions [facility] Clinical Social Worker Direct Number:  [phone] [email or web address]   CAGE-AID Screening:    Have You Ever Felt You Ought to Cut Down on Your Drinking or Drug Use?: Yes Have People Annoyed You By [employer] Your Drinking Or Drug Use?: No Have You Felt Bad Or Guilty About Your Drinking Or Drug Use?: No Have You Ever Had a Drink or Used Drugs First Thing In The Morning to Steady Your Nerves or to Get Rid of a Hangover?: No CAGE-AID Score: 1  Substance Abuse Education Offered: Yes

## 2021-04-27 DIAGNOSIS — I6339 Cerebral infarction due to thrombosis of other cerebral artery: Secondary | ICD-10-CM

## 2021-04-27 MED ORDER — ASPIRIN EC 81 MG PO TBEC: 81.0000 mg | DELAYED_RELEASE_TABLET | Freq: Every day | ORAL | 2 refills | Status: AC

## 2021-04-27 MED ORDER — ROSUVASTATIN CALCIUM 20 MG PO TABS: 20.0000 mg | ORAL_TABLET | Freq: Every day | ORAL | 0 refills | Status: DC

## 2021-04-27 NOTE — TOC Transition Note (Signed)
Transition of Care [facility]) - CM/SW Discharge Note   Patient Details  Name: Melissa Olsen MRN: [id number] Date of Birth: Sep 17, 1966  Transition of Care [facility]) CM/SW Contact:  Pollie Friar, RN Phone Number: 04/27/2021, 10:52 AM   Clinical Narrative:    Patient is discharging home with outpatient therapy at [facility]. Information on the AVS.  Pt denies issues with transportation or home medications.  Pt's daughter can provide supervision until mid August.  Pt has transport home.   Final next level of care: OP Rehab Barriers to Discharge: No Barriers Identified   Patient Goals and CMS Choice        Discharge Placement                       Discharge Plan and Services                                     Social Determinants of Health (SDOH) Interventions     Readmission Risk Interventions No flowsheet data found.

## 2021-04-27 NOTE — Discharge Summary (Addendum)
Stroke Discharge Summary  Patient ID: Cumi Hagemann   MRN: [id number]      DOB: Apr 14, 1966  Date of Admission: 04/25/2021 Date of Discharge: 04/27/2021  Attending Physician:  Dr. Leonie Man Patient's PCP:  None  DISCHARGE DIAGNOSIS:   Stroke like episode s/p iv TPA treatment- possibly TIA Elevated blood pressure readings Type 2 Diabetes Mellitus Class 1 obesity Atypical chest pain   Allergies as of 04/27/2021   Not on File      Medication List     TAKE these medications    aspirin EC 81 MG tablet Take 1 tablet (81 mg total) by mouth daily. Swallow whole.   esomeprazole 40 MG capsule Commonly known as: NEXIUM Take 40 mg by mouth daily.   metFORMIN 500 MG tablet Commonly known as: GLUCOPHAGE Take 500 mg by mouth every morning.   multivitamin capsule Take 1 capsule by mouth daily.   rosuvastatin 20 MG tablet Commonly known as: CRESTOR Take 1 tablet (20 mg total) by mouth daily. Start taking on: April 28, 2021        LABORATORY STUDIES CBC    Component Value Date/Time   WBC 5.7 04/25/2021 1234   RBC 4.41 04/25/2021 1234   HGB 12.9 04/25/2021 1235   HCT 38.0 04/25/2021 1235   PLT 295 04/25/2021 1234   MCV 86.6 04/25/2021 1234   MCH 28.6 04/25/2021 1234   MCHC 33.0 04/25/2021 1234   RDW 13.5 04/25/2021 1234   LYMPHSABS 2.2 04/25/2021 1234   MONOABS 0.3 04/25/2021 1234   EOSABS 0.1 04/25/2021 1234   BASOSABS 0.0 04/25/2021 1234   CMP    Component Value Date/Time   NA 142 04/25/2021 1235   K 3.7 04/25/2021 1235   CL 105 04/25/2021 1235   CO2 25 04/25/2021 1234   GLUCOSE 115 (H) 04/25/2021 1235   BUN 13 04/25/2021 1235   CREATININE 0.80 04/25/2021 1235   CALCIUM 9.2 04/25/2021 1234   PROT 6.7 04/25/2021 1234   ALBUMIN 3.8 04/25/2021 1234   AST 14 (L) 04/25/2021 1234   ALT 12 04/25/2021 1234   ALKPHOS 62 04/25/2021 1234   BILITOT 0.7 04/25/2021 1234   GFRNONAA >60 04/25/2021 1234   COAGS Lab Results  Component Value Date   INR 1.0 04/25/2021    Lipid Panel    Component Value Date/Time   CHOL 193 04/26/2021 0912   TRIG 103 04/26/2021 0912   HDL 57 04/26/2021 0912   CHOLHDL 3.4 04/26/2021 0912   VLDL 21 04/26/2021 0912   LDLCALC 115 (H) 04/26/2021 0912   HgbA1C  Lab Results  Component Value Date   HGBA1C 6.6 (H) 04/26/2021   Urinalysis    Component Value Date/Time   COLORURINE YELLOW 04/25/2021 1825   APPEARANCEUR CLEAR 04/25/2021 1825   LABSPEC 1.040 (H) 04/25/2021 1825   PHURINE 6.0 04/25/2021 1825   GLUCOSEU NEGATIVE 04/25/2021 1825   HGBUR SMALL (A) 04/25/2021 1825   BILIRUBINUR NEGATIVE 04/25/2021 1825   KETONESUR NEGATIVE 04/25/2021 1825   PROTEINUR NEGATIVE 04/25/2021 1825   NITRITE NEGATIVE 04/25/2021 1825   LEUKOCYTESUR NEGATIVE 04/25/2021 1825   Urine Drug Screen     Component Value Date/Time   LABOPIA POSITIVE (A) 04/25/2021 1826   COCAINSCRNUR NONE DETECTED 04/25/2021 1826   LABBENZ NONE DETECTED 04/25/2021 1826   AMPHETMU NONE DETECTED 04/25/2021 1826   THCU POSITIVE (A) 04/25/2021 1826   LABBARB NONE DETECTED 04/25/2021 1826   SIGNIFICANT DIAGNOSTIC STUDIES CT HEAD WO CONTRAST  Result Date: 04/26/2021 CLINICAL  DATA:  Neurological deficit, acute, stroke suspected. TPA follow-up. EXAM: CT HEAD WITHOUT CONTRAST TECHNIQUE: Contiguous axial images were obtained from the base of the skull through the vertex without intravenous contrast. COMPARISON:  CT and MRI studies yesterday. FINDINGS: Brain: The brain shows a normal appearance without evidence of malformation, atrophy, old or acute small or large vessel infarction, mass lesion, hemorrhage, hydrocephalus or extra-axial collection. Vascular: No hyperdense vessel. No evidence of atherosclerotic calcification. Skull: Normal.  No traumatic finding.  No focal bone lesion. Sinuses/Orbits: Previous functional endoscopic sinus surgery. Mucosal thickening in the ethmoid sinus regions. Opacification of the frontal and sphenoid sinuses. Orbits negative. Other:  None significant IMPRESSION: Normal appearance of the brain itself. No sign of stroke or hemorrhage. Previous functional endoscopic sinus surgery. Ongoing sinus inflammatory disease as above. Electronically Signed   By: Nelson Chimes M.D.   On: 04/26/2021 13:04   MR BRAIN WO CONTRAST  Result Date: 04/25/2021 CLINICAL DATA:  Neuro deficit, acute, stroke suspected EXAM: MRI HEAD WITHOUT CONTRAST TECHNIQUE: Multiplanar, multiecho pulse sequences of the brain and surrounding structures were obtained without intravenous contrast. COMPARISON:  None. FINDINGS: Brain: No acute infarct, mass effect or extra-axial collection. No acute or chronic hemorrhage. Normal white matter signal, parenchymal volume and CSF spaces. The midline structures are normal. Vascular: Major flow voids are preserved. Skull and upper cervical spine: Normal calvarium and skull base. Visualized upper cervical spine and soft tissues are normal. Sinuses/Orbits:No paranasal sinus fluid levels or advanced mucosal thickening. No mastoid or middle ear effusion. Normal orbits. IMPRESSION: Normal brain MRI. Electronically Signed   By: Ulyses Jarred M.D.   On: 04/25/2021 19:35   CT ANGIO CHEST AORTA W/CM & OR WO/CM  Result Date: 04/25/2021 CLINICAL DATA:  Chest pain, code stroke, question aortic dissection EXAM: CT ANGIOGRAPHY CHEST WITH CONTRAST TECHNIQUE: Multidetector CT imaging of the chest was performed using the standard protocol during bolus administration of intravenous contrast. Multiplanar CT image reconstructions and MIPs were obtained to evaluate the vascular anatomy. Precontrast images were also obtained. CONTRAST:  144m OMNIPAQUE IOHEXOL 350 MG/ML SOLN IV COMPARISON:  None FINDINGS: Cardiovascular: No intramural hematoma on precontrast imaging. Normal aortic enhancement following IV contrast. Aorta normal caliber. No evidence of aortic aneurysm or dissection. Pulmonary arteries patent on non targeted exam. No evidence of pulmonary  embolism. Heart unremarkable. No pericardial effusion. Mediastinum/Nodes: Esophagus unremarkable. Base of cervical region normal appearance. No thoracic adenopathy. Lungs/Pleura: Dependent atelectasis in the posterior lungs. Remaining lungs clear. No pulmonary infiltrate, pleural effusion, or pneumothorax. Upper Abdomen: Low-attenuation foci at upper pole RIGHT kidney question cysts. Remaining visualized upper abdomen unremarkable. Musculoskeletal: No acute osseous findings. Review of the MIP images confirms the above findings. IMPRESSION: No evidence of aortic aneurysm or dissection. No evidence of pulmonary embolism. Dependent atelectasis in the posterior lungs. Low-attenuation foci at upper pole RIGHT kidney question cysts. Findings called to Dr. SQuinn Axeon 04/25/2021 at 1317 hours. Electronically Signed   By: MLavonia DanaM.D.   On: 04/25/2021 13:17   ECHOCARDIOGRAM COMPLETE  Result Date: 04/26/2021    ECHOCARDIOGRAM REPORT   Patient Name:   LTAJESIMMS Date of Exam: 04/26/2021 Medical Rec #:  [id number]    Height:       68.0 in Accession #:    [id number]   Weight:       191.8 lb Date of Birth:  412-05-1966     BSA:          2.008 m Patient Age:  55 years      BP:           133/100 mmHg Patient Gender: F             HR:           68 bpm. Exam Location:  Inpatient Procedure: 2D Echo, Cardiac Doppler and Color Doppler Indications:    Stroke I63.9  History:        Patient has no prior history of Echocardiogram examinations.  Sonographer:    Jonelle Sidle Dance Referring Phys: [place]  1. Left ventricular ejection fraction, by estimation, is 55 to 60%. Left ventricular ejection fraction by 3D volume is 54 %. The left ventricle has normal function. The left ventricle has no regional wall motion abnormalities. Left ventricular diastolic  parameters are consistent with Grade I diastolic dysfunction (impaired relaxation).  2. Right ventricular systolic function is normal. The right ventricular  size is normal. There is normal pulmonary artery systolic pressure. The estimated right ventricular systolic pressure is [id number] mmHg.  3. The mitral valve is grossly normal. Trivial mitral valve regurgitation. No evidence of mitral stenosis.  4. The aortic valve is tricuspid. Aortic valve regurgitation is not visualized. No aortic stenosis is present.  5. The inferior vena cava is normal in size with greater than 50% respiratory variability, suggesting right atrial pressure of 3 mmHg. Comparison(s): No prior Echocardiogram. FINDINGS  Left Ventricle: Left ventricular ejection fraction, by estimation, is 55 to 60%. Left ventricular ejection fraction by 3D volume is 54 %. The left ventricle has normal function. The left ventricle has no regional wall motion abnormalities. The left ventricular internal cavity size was normal in size. There is no left ventricular hypertrophy. Left ventricular diastolic parameters are consistent with Grade I diastolic dysfunction (impaired relaxation). Right Ventricle: The right ventricular size is normal. No increase in right ventricular wall thickness. Right ventricular systolic function is normal. There is normal pulmonary artery systolic pressure. The tricuspid regurgitant velocity is 1.91 m/s, and  with an assumed right atrial pressure of 3 mmHg, the estimated right ventricular systolic pressure is [id number] mmHg. Left Atrium: Left atrial size was normal in size. Right Atrium: Right atrial size was normal in size. Pericardium: There is no evidence of pericardial effusion. Mitral Valve: The mitral valve is grossly normal. Trivial mitral valve regurgitation. No evidence of mitral valve stenosis. Tricuspid Valve: The tricuspid valve is normal in structure. Tricuspid valve regurgitation is mild . No evidence of tricuspid stenosis. Aortic Valve: The aortic valve is tricuspid. Aortic valve regurgitation is not visualized. No aortic stenosis is present. Pulmonic Valve: The pulmonic valve was  normal in structure. Pulmonic valve regurgitation is not visualized. No evidence of pulmonic stenosis. Aorta: The aortic root and ascending aorta are structurally normal, with no evidence of dilitation. Venous: The inferior vena cava is normal in size with greater than 50% respiratory variability, suggesting right atrial pressure of 3 mmHg. IAS/Shunts: The atrial septum is grossly normal.  LEFT VENTRICLE PLAX 2D LVIDd:         4.40 cm         Diastology LVIDs:         3.20 cm         LV e' medial:    0.07 cm/s LV PW:         0.80 cm         LV E/e' medial:  6.4 LV IVS:        0.90 cm  LV e' lateral:   0.09 cm/s LVOT diam:     1.80 cm         LV E/e' lateral: 5.3 LV SV:         39 LV SV Index:   20 LVOT Area:     2.54 cm        3D Volume EF                                LV 3D EF:    Left                                             ventricular                                             ejection                                             fraction by                                             3D volume                                             is 54 %.                                 3D Volume EF:                                3D EF:        54 % RIGHT VENTRICLE            IVC RV Basal diam:  3.00 cm    IVC diam: 1.80 cm RV S prime:     9.14 cm/s TAPSE (M-mode): 2.0 cm LEFT ATRIUM             Index       RIGHT ATRIUM           Index LA diam:        3.00 cm 1.49 cm/m  RA Area:     12.40 cm LA Vol (A2C):   48.8 ml 24.31 ml/m RA Volume:   27.50 ml  13.70 ml/m LA Vol (A4C):   29.0 ml 14.44 ml/m LA Biplane Vol: 37.8 ml 18.83 ml/m  AORTIC VALVE LVOT Vmax:   73.10 cm/s LVOT Vmean:  55.800 cm/s LVOT VTI:    0.155 m  AORTA Ao Root diam: 2.80 cm Ao Asc diam:  3.30 cm MITRAL VALVE               TRICUSPID VALVE MV Area (PHT): 2.80 cm    TR Peak grad:  14.6 mmHg MV Decel Time: 271 msec    TR Vmax:        191.00 cm/s MV E velocity: 0.47 cm/s MV A velocity: 57.80 cm/s  SHUNTS MV E/A ratio:  0.01        Systemic  VTI:  0.16 m                            Systemic Diam: 1.80 cm Rudean Haskell MD Electronically signed by Rudean Haskell MD Signature Date/Time: 04/26/2021/4:32:09 PM    Final    CT HEAD CODE STROKE WO CONTRAST  Result Date: 04/25/2021 CLINICAL DATA:  Code stroke. Neuro deficit, acute, stroke suspected. Additional history provided: Headache, blurred vision, right leg weakness. EXAM: CT HEAD WITHOUT CONTRAST TECHNIQUE: Contiguous axial images were obtained from the base of the skull through the vertex without intravenous contrast. COMPARISON:  No pertinent prior exams available for comparison. FINDINGS: Brain: Cerebral volume is normal. Minimal patchy hypoattenuation within the cerebral white matter is nonspecific, but most often secondary to chronic small vessel ischemia. There is no acute intracranial hemorrhage. No demarcated cortical infarct. No extra-axial fluid collection. No evidence of an intracranial mass. No midline shift. Vascular: No hyperdense vessel.  Atherosclerotic calcifications Skull: Normal. Negative for fracture or focal lesion. Sinuses/Orbits: Visualized orbits show no acute finding. Mild mucosal thickening and fluid within the bilateral ethmoid sinuses. Mild mucosal thickening within the right sphenoid sinus. 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 Mild mucosal thickening within the visualized maxillary sinuses with associated chronic reactive osteitis. ASPECTS ([place] Stroke Program Early CT Score) - Ganglionic level infarction (caudate, lentiform nuclei, internal capsule, insula, M1-M3 cortex): 7 - Supraganglionic infarction (M4-M6 cortex): 3 Total score (0-10 with 10 being normal): 10 These results were communicated to Dr. Quinn Axe At 12:46 pmon 7/20/2022by text page via the [facility] messaging system. IMPRESSION: No evidence of acute intracranial abnormality.  ASPECTS is 10. Mild patchy hypoattenuation within the cerebral white  matter, nonspecific but most often secondary to chronic small vessel ischemia. Paranasal sinus disease at the imaged levels, as described. Electronically Signed   By: Kellie Simmering DO   On: 04/25/2021 12:47   CT ANGIO HEAD NECK W WO CM (CODE STROKE)  Result Date: 04/25/2021 CLINICAL DATA:  Neurological deficit. Acute stroke suspected. Headache. Blurred vision. Right leg weakness. EXAM: CT ANGIOGRAPHY HEAD AND NECK TECHNIQUE: Multidetector CT imaging of the head and neck was performed using the standard protocol during bolus administration of intravenous contrast. Multiplanar CT image reconstructions and MIPs were obtained to evaluate the vascular anatomy. Carotid stenosis measurements (when applicable) are obtained utilizing NASCET criteria, using the distal internal carotid diameter as the denominator. CONTRAST:  147m OMNIPAQUE IOHEXOL 350 MG/ML SOLN COMPARISON:  Head CT earlier same day. FINDINGS: CTA NECK FINDINGS Aortic arch: Aortic arch is normal.  Branching pattern is normal. Right carotid system: Common carotid artery widely patent to the bifurcation. Carotid bifurcation is normal without soft or calcified plaque. Cervical ICA is normal. Left carotid system: Common carotid artery widely patent to the bifurcation. Carotid bifurcation is normal without soft or calcified plaque. Cervical ICA is normal. Vertebral arteries: Both vertebral artery origins are widely patent. The right vertebral artery takes an early origin from the proximal subclavian. Both vertebral arteries appear normal through the cervical region to the foramen magnum. Skeleton: Ordinary mid cervical spondylosis. Other neck: No mass or lymphadenopathy. Upper chest: Lung apices are clear. Review of  the MIP images confirms the above findings CTA HEAD FINDINGS Anterior circulation: Both internal carotid arteries are widely patent through the skull base and siphon regions. The anterior and middle cerebral vessels are patent. No large vessel  occlusion. No visible proximal stenosis. No aneurysm or vascular malformation. Posterior circulation: Both vertebral arteries are widely patent to the basilar. No basilar stenosis. Posterior circulation branch vessels are normal. Venous sinuses: Patent and normal. Anatomic variants: None significant. Review of the MIP images confirms the above findings IMPRESSION: No large vessel occlusion. Carotid bifurcations appear normal. No other atherosclerotic vascular disease evident in the region. Electronically Signed   By: Nelson Chimes M.D.   On: 04/25/2021 13:12      HISTORY OF PRESENT ILLNESS Ms. Terran Dimitriou is a 55 y.o. female with diabetes who presented via EMS for acute onset chest pain and right sided weakness. Symptoms began while sitting at her desk at work.    HOSPITAL COURSE She presented as a code stroke. Initial NIHSS was 4 for facial palsy, 1 RUE drift, 2 RLE drift. Symptoms improved over the course of the code stroke.  CT head was negative for acute infarct or hemorrhage.  She received tPA.  Follow up head/neck imaging including brain MRI and CTA head/neck were negative for acute infarct. Echocardiogram was unremarkable.  She was evaluated by physical therapy and was recommended to follow up with them outpatient.     DISCHARGE EXAM Blood pressure 135/80, pulse 85, temperature 98.5 F (36.9 C), temperature source Oral, resp. rate 14, height '5\' 8"'$  (1.727 m), weight 87 kg, SpO2 99 %. General: Mildly obese middle-aged African-American lady, in no acute distress Cardiac: regular rate and rhythm Neurologic exam: Mental status: alert and oriented Speech: normal pattern and articulation Cranial nerves: EOM intact. Peripheral vision intact. Face symmetric. Hearing intact Motor: 5/5 strength in the bilateral upper and lower extremities. Gait deferred. Sensation: intact throughout the extremities Cerebellar: normal finger to nose. Normal rapid alternating movements.   DISCHARGE  PLAN  Stroke like episode s/p tPA -start Asprin and Crestor at discharge -control risk factors -follow up with PT -no neurology follow up is required unless symtpoms change -LDL goal <70  Elevated blood pressure readings Intermittently elevated in the hospital. -follow up with PCP for ongoing monitoring  Type 2 Diabetes Mellitus. A1C 6.6 -resume metformin '500mg'$  daily -follow up with PCP for ongoing mornitoring  Class 1 Obesity Discussed lifestyle modifications -follow up with PCP -may be a good candidate for a GLP-1 agonist  Marijuana use disorder. Encourage cessation    PCP needs: recently moved to [place] about 12 months ago and has not yet established with a provider. She was encouraged to establish with a practice.  Mitzi Hansen, MD Internal Medicine Resident PGY-3 [facility] Pager: [phone] 04/27/2021 1:20 PM    I have personally obtained history,examined this patient, reviewed notes, independently viewed imaging studies, participated in medical decision making and plan of care.ROS completed by me personally and pertinent positives fully documented  I have made any additions or clarifications directly to the above note. Agree with note above.  Time spent in discharge summary 32 minutes.  Antony Contras, MD Medical Director [facility] Pager: [phone] 04/27/2021 1:24 PM

## 2021-04-27 NOTE — Progress Notes (Signed)
Physical Therapy Treatment Patient Details Name: Melissa Olsen MRN: [id number] DOB: 04/20/1966 Today's Date: 04/27/2021    History of Present Illness Pt is a 55 y.o. female who presented 04/25/21 with chest pain and R sided weakness. NIHSS = 4. tPA administered. Imaging of chest negative for aortic aneurysm or dissection or PE. Brain MRI normal. No significant PMH.    PT Comments    Patient continues to demos slow guarded gait with no AD. Incorporated head turns during ambulation but patient hesitant to perform and decreases gait speed. Educated patient need for multi-tasking during gait for open environment in community with uncontrollable distractions. Encouraged patient to have supervision for mobility outside of home for safety, patient verbalized understanding. Continue to recommend OPPT following discharge to address balance and strength deficits.     Follow Up Recommendations  Outpatient PT;Supervision for mobility/OOB     Equipment Recommendations  Rolling Ruqayyah Lute with 5" wheels    Recommendations for Other Services       Precautions / Restrictions Precautions Precautions: Fall Restrictions Weight Bearing Restrictions: No    Mobility  Bed Mobility Overal bed mobility: Independent                  Transfers Overall transfer level: Independent                  Ambulation/Gait Ambulation/Gait assistance: Min guard;Supervision Gait Distance (Feet): 250 Feet Assistive device: None Gait Pattern/deviations: Step-through pattern;Decreased stride length;Narrow base of support Gait velocity: decreased   General Gait Details: very slow guarded gait. Patient with minimal arm swing during ambulation. Instructed patient for horizontal and vertical head turns during gait. Patient reports feeling uncomfortable ambulating with head turns. Educated patient on importance of incorporating head turns due to community ambulation with uncontrolled distractions.   Stairs              Wheelchair Mobility    Modified Rankin (Stroke Patients Only)       Balance Overall balance assessment: Needs assistance Sitting-balance support: No upper extremity supported;Feet supported Sitting balance-Leahy Scale: Normal     Standing balance support: No upper extremity supported Standing balance-Leahy Scale: Fair Standing balance comment: supervision for safety                            Cognition Arousal/Alertness: Awake/alert Behavior During Therapy: WFL for tasks assessed/performed Overall Cognitive Status: Within Functional Limits for tasks assessed                                        Exercises      General Comments        Pertinent Vitals/Pain Pain Assessment: No/denies pain    Home Living                      Prior Function            PT Goals (current goals can now be found in the care plan section) Acute Rehab PT Goals Patient Stated Goal: to get back to normal PT Goal Formulation: With patient Time For Goal Achievement: 05/10/21 Potential to Achieve Goals: Good Progress towards PT goals: Progressing toward goals    Frequency    Min 4X/week      PT Plan Current plan remains appropriate    Co-evaluation  AM-PAC PT "6 Clicks" Mobility   Outcome Measure  Help needed turning from your back to your side while in a flat bed without using bedrails?: None Help needed moving from lying on your back to sitting on the side of a flat bed without using bedrails?: None Help needed moving to and from a bed to a chair (including a wheelchair)?: None Help needed standing up from a chair using your arms (e.g., wheelchair or bedside chair)?: None Help needed to walk in hospital room?: A Little Help needed climbing 3-5 steps with a railing? : A Little 6 Click Score: 22    End of Session Equipment Utilized During Treatment: Gait belt Activity Tolerance: Patient tolerated  treatment well Patient left: in bed;with call bell/phone within reach Nurse Communication: Mobility status PT Visit Diagnosis: Unsteadiness on feet (R26.81);Other abnormalities of gait and mobility (R26.89);Muscle weakness (generalized) (M62.81);Difficulty in walking, not elsewhere classified (R26.2);Other symptoms and signs involving the nervous system (R29.898)     Time: [id number] PT Time Calculation (min) (ACUTE ONLY): 13 min  Charges:  $Gait Training: 8-22 mins                     Clayborne Divis A. Gilford Rile PT, DPT Acute Rehabilitation Services Pager [phone] Office [phone]    Linna Hoff 04/27/2021, 12:30 PM

## 2021-04-27 NOTE — Plan of Care (Signed)
  Problem: Education: Goal: Knowledge of disease or condition will improve Outcome: Progressing Goal: Knowledge of secondary prevention will improve Outcome: Progressing   

## 2021-04-27 NOTE — Progress Notes (Signed)
Discharge instructions reviewed with patient. Medication instructions given and follow up instructions reviewed. PIV removed. Pt states that ride will be here in a few minutes.

## 2021-04-27 NOTE — Plan of Care (Signed)
  Problem: Education: Goal: Knowledge of disease or condition will improve Outcome: Adequate for Discharge Goal: Knowledge of secondary prevention will improve Outcome: Adequate for Discharge

## 2021-04-30 ENCOUNTER — Telehealth: Payer: Self-pay | Admitting: Internal Medicine

## 2021-04-30 NOTE — Telephone Encounter (Signed)
TOC HFU appointment 05/08/2021 AT 3:15 pm with Dr. Alfonse Spruce.  Attempted to contact patient, but no answer.  Left detailed message on voicemail asking to please give the clinic a call back. Appointment card mailed to patient.

## 2021-04-30 NOTE — Telephone Encounter (Signed)
-----   Message from Mitzi Hansen, MD sent at 04/27/2021 10:25 AM EDT ----- Please call to arrange an office visit for establishment of care and hospital follow up

## 2021-05-03 NOTE — Telephone Encounter (Signed)
Transition Care Management Unsuccessful Follow-up Telephone Call  Date of discharge and from where:  Discharged 04/27/21 from the hospital.  Attempts:  1st Attempt  Reason for unsuccessful TCM follow-up call:  Called pt - no answer.

## 2021-05-08 ENCOUNTER — Encounter: Payer: BC Managed Care – PPO | Admitting: Student

## 2021-05-11 DIAGNOSIS — L249 Irritant contact dermatitis, unspecified cause: Secondary | ICD-10-CM | POA: Insufficient documentation

## 2021-05-28 ENCOUNTER — Other Ambulatory Visit: Payer: Self-pay | Admitting: Internal Medicine

## 2021-07-18 ENCOUNTER — Encounter: Payer: Self-pay | Admitting: *Deleted

## 2021-07-18 NOTE — Progress Notes (Unsigned)
[facility] - 07/18/21 1000       Assessment    Assessment type Phone to patient    Is patient still in hospital? No    Date of hospital discharge after thrombolysis? 04/27/21      Final 90-Day Modified Rankin Score   Final 90-Day Modified Rankin Score: ([facility]) 0-No symptoms from stroke remain, but able to carry out all usual activities      [id number]   Mobility 1- no problems in walking about    Self-care 1- no problems with Self-care    Usual activities 1- no problems with performing usual activities (e.g. work, study, housework, family or leisure activities)    Pain/discomfort 1- no pain or discomfort    Anxiety/Depression 1- not anxious or depressed    What number between 0-100 best describes the patient's health state today (100 means the best health; 0 means the worst health)? [facility] Admission   In the Past 3 months (since your initial hospitalisation for stroke), have you been admitted to hospital (including day-only procedures) for any reason? No      a. Doctor consultations   a. Type of service Fam Med    a. Condition or purpose physical    a. Date of appointment 05/04/21      b. Doctor consultations   b. Type of service Dermatology    b. Condition or purpose dermatits    b. Date of appointment 05/10/21

## 2021-08-08 ENCOUNTER — Other Ambulatory Visit: Payer: Self-pay

## 2021-08-08 NOTE — Patient Outreach (Signed)
[facility]) Care Management  08/08/2021  Melissa Olsen 1965/11/29 [id number]   Telephone outreach to patient by Hollice Espy to obtain mRS was successfully completed. MRS= 0 on 07/18/21    [place] Care Management Assistant

## 2021-11-13 ENCOUNTER — Ambulatory Visit (INDEPENDENT_AMBULATORY_CARE_PROVIDER_SITE_OTHER): Payer: BC Managed Care – PPO | Admitting: Podiatry

## 2021-11-13 ENCOUNTER — Ambulatory Visit (INDEPENDENT_AMBULATORY_CARE_PROVIDER_SITE_OTHER): Payer: BC Managed Care – PPO

## 2021-11-13 ENCOUNTER — Other Ambulatory Visit: Payer: Self-pay

## 2021-11-13 DIAGNOSIS — D492 Neoplasm of unspecified behavior of bone, soft tissue, and skin: Secondary | ICD-10-CM

## 2021-11-13 DIAGNOSIS — M7750 Other enthesopathy of unspecified foot: Secondary | ICD-10-CM

## 2021-11-13 DIAGNOSIS — M7752 Other enthesopathy of left foot: Secondary | ICD-10-CM | POA: Diagnosis not present

## 2021-11-13 DIAGNOSIS — M66372 Spontaneous rupture of flexor tendons, left ankle and foot: Secondary | ICD-10-CM

## 2021-11-13 MED ORDER — METHYLPREDNISOLONE 4 MG PO TBPK: ORAL_TABLET | ORAL | 0 refills | Status: AC

## 2021-11-13 NOTE — Patient Instructions (Signed)
Call [facility] at [phone] to schedule your MRI

## 2021-11-15 ENCOUNTER — Other Ambulatory Visit: Payer: Self-pay

## 2021-11-15 ENCOUNTER — Ambulatory Visit
Admission: RE | Admit: 2021-11-15 | Discharge: 2021-11-15 | Disposition: A | Discharge status: Home / Self Care | Payer: BC Managed Care – PPO | Source: Ambulatory Visit | Attending: Podiatry | Admitting: Podiatry

## 2021-11-15 DIAGNOSIS — M66372 Spontaneous rupture of flexor tendons, left ankle and foot: Secondary | ICD-10-CM

## 2021-11-15 DIAGNOSIS — D492 Neoplasm of unspecified behavior of bone, soft tissue, and skin: Secondary | ICD-10-CM

## 2021-11-15 IMAGING — MR MR ANKLE*L* WO/W CM
9 series · 40 of 40 positions shown · IV contrast (17ml Multihance)
Comparison: Susceptibility artifact from prior distal first and
fifth metatarsal osteotomies

CLINICAL DATA: Achilles tendon trauma or laceration Musculoskeletal
neoplasm, staging. Mass marked with vitamin-E capsule. Pain and
swelling

EXAM:
MRI OF THE LEFT ANKLE WITHOUT AND WITH CONTRAST
TECHNIQUE: Multiplanar, multisequence MR imaging of the ankle was performed
before and after the administration of intravenous contrast.
CONTRAST:  17mL MULTIHANCE GADOBENATE DIMEGLUMINE 529 MG/ML IV SOLN

[Series 4: T2 fat-sat · axial · 3.0mm · 0.50mm/px · z∈[-89,+43]mm · 6 of 35 slices shown (1 of 2)]
[im 1/35]
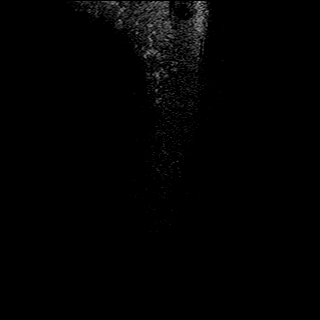
[im 7/35]
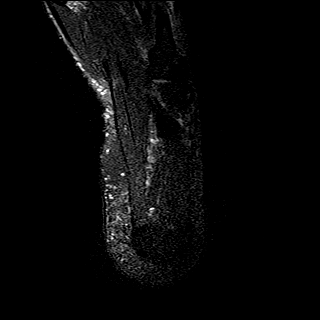
[im 14/35]
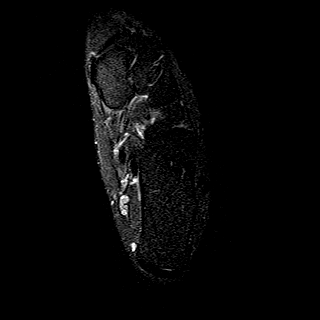
[im 21/35]
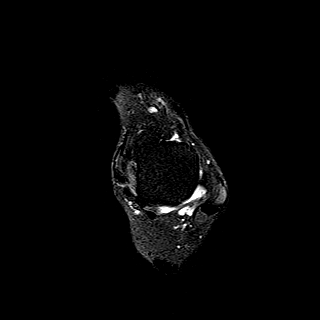
[im 28/35]
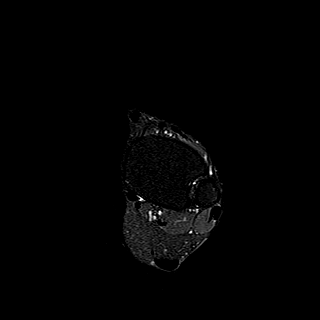
[im 35/35]
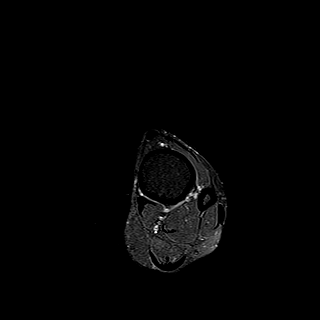

[Series 5: T1 · axial · 3.0mm · 0.50mm/px · z∈[-90,+43]mm · 5 of 35 slices shown (1 of 2)]
[im 1/35]
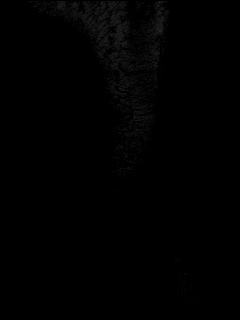
[im 9/35]
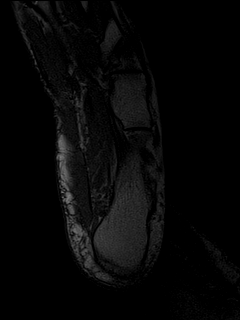
[im 18/35]
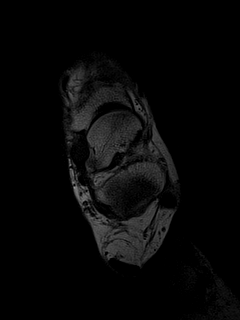
[im 26/35]
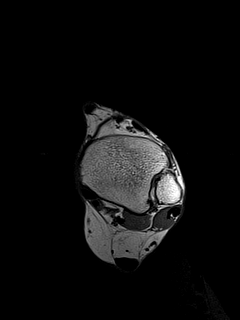
[im 35/35]
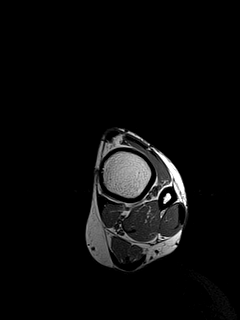

[Series 6: T1 · sagittal · 4.0mm · 0.56mm/px · 3 of 20 slices shown (2 of 2)]
[im 1/20]
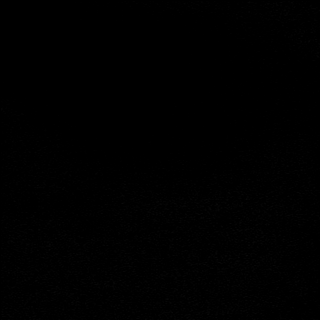
[im 10/20]
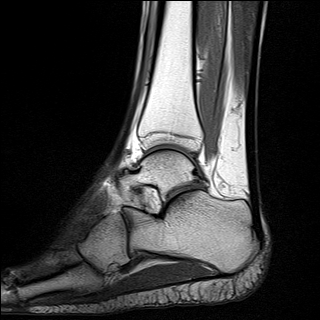
[im 20/20]
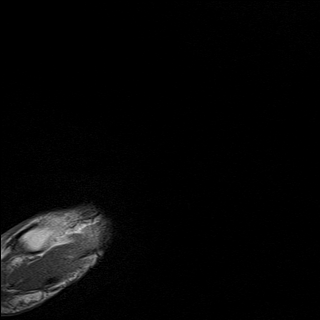

[Series 7: STIR · sagittal · 4.0mm · 0.35mm/px · 3 of 20 slices shown]
[im 1/20]
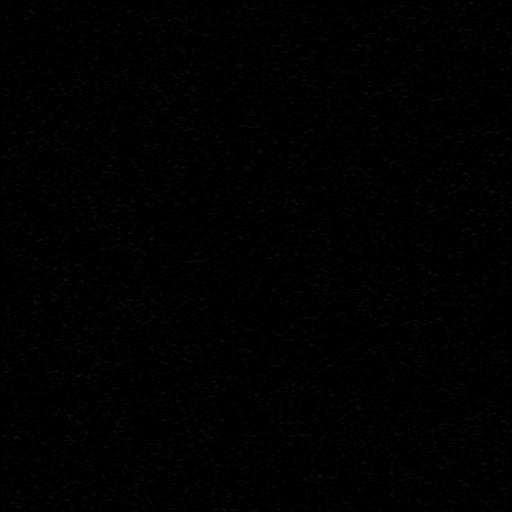
[im 10/20]
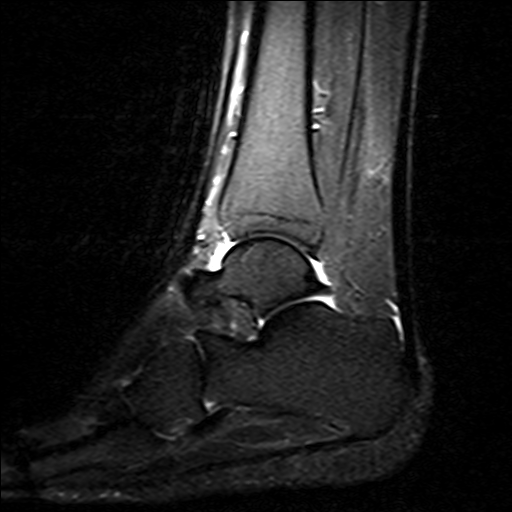
[im 20/20]
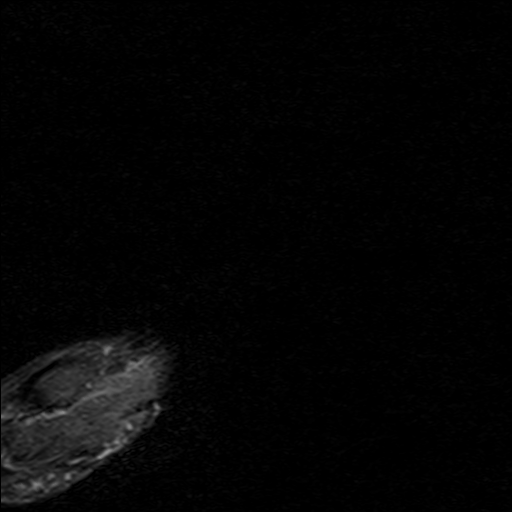

[Series 8: T2 fat-sat · coronal · 3.0mm · 0.50mm/px · 5 of 32 slices shown (2 of 2)]
[im 1/32]
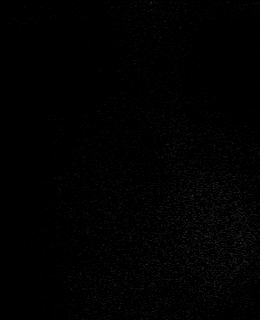
[im 8/32]
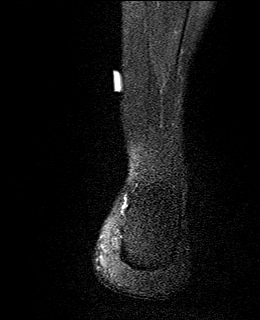
[im 16/32]
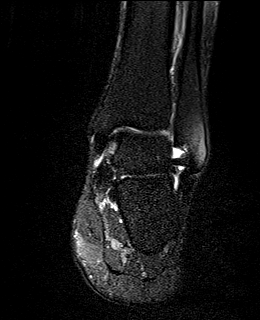
[im 24/32]
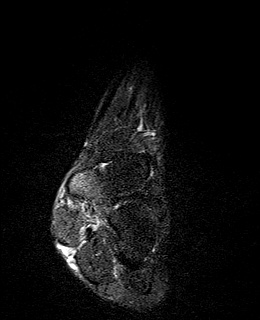
[im 32/32]
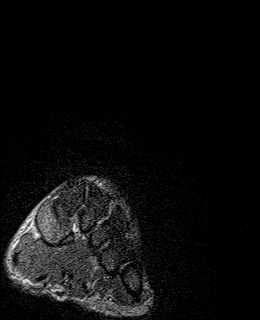

[Series 9: T1 fat-sat · axial · 3.0mm · 0.62mm/px · z∈[-90,+43]mm · 5 of 35 slices shown]
[im 1/35]
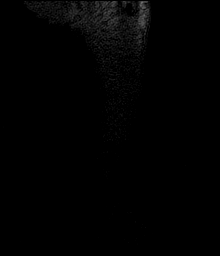
[im 9/35]
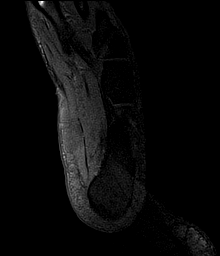
[im 18/35]
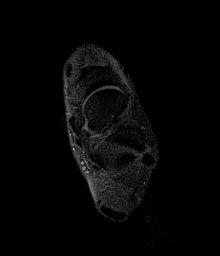
[im 26/35]
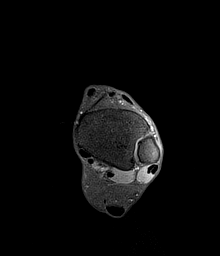
[im 35/35]
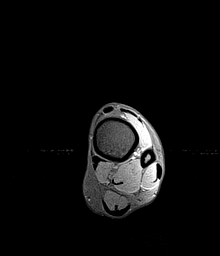

[Series 10: T1 post-contrast · axial · 3.0mm · 0.62mm/px · z∈[-90,+43]mm · 5 of 35 slices shown (1 of 2)]
[im 1/35]
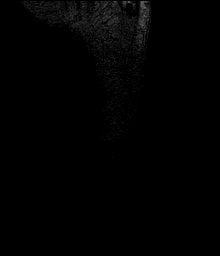
[im 9/35]
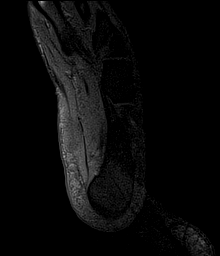
[im 18/35]
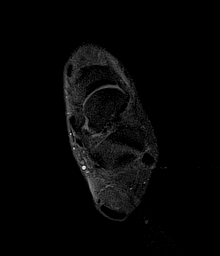
[im 26/35]
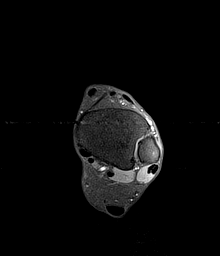
[im 35/35]
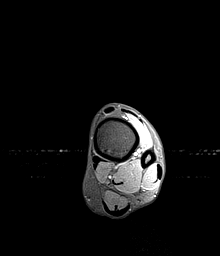

[Series 11: T1 post-contrast · sagittal · 4.0mm · 0.56mm/px · 3 of 20 slices shown (2 of 2)]
[im 1/20]
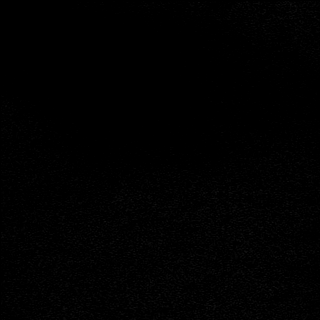
[im 10/20]
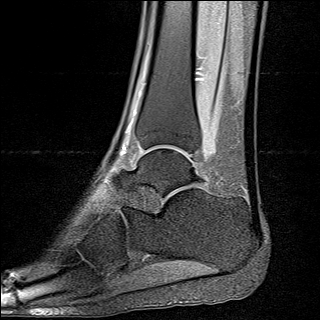
[im 20/20]
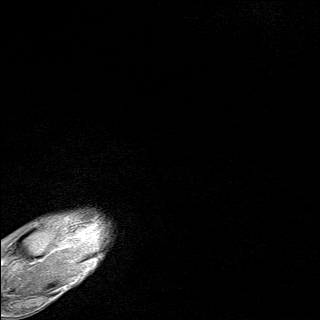

[Series 12: cor post · coronal · 3.0mm · 0.31mm/px · 5 of 32 slices shown]
[im 1/32]
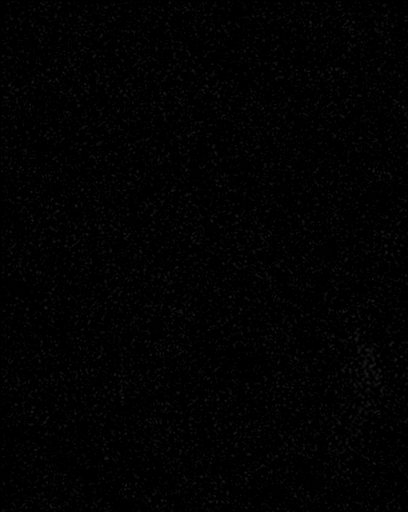
[im 8/32]
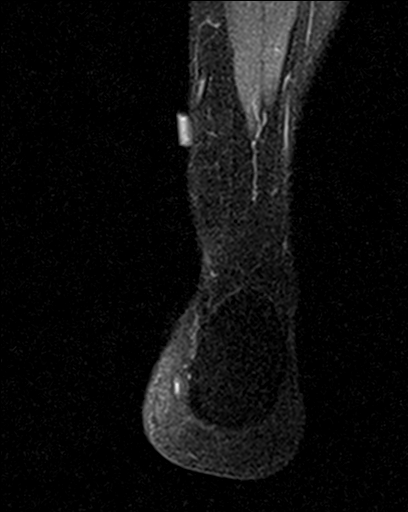
[im 16/32]
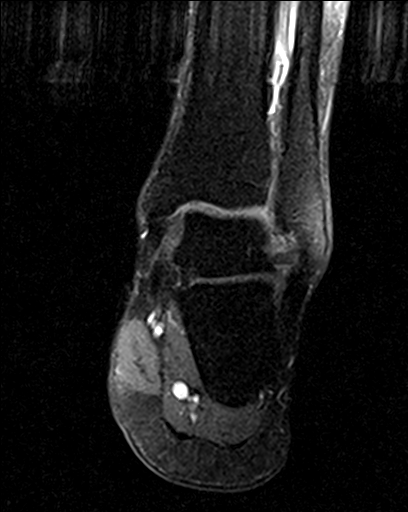
[im 24/32]
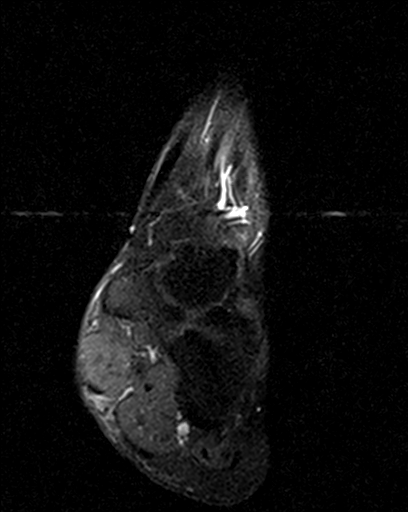
[im 32/32]
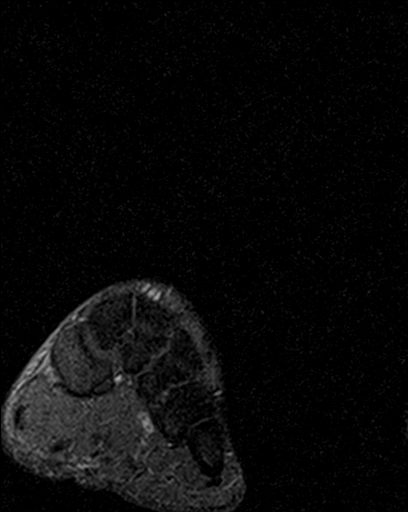

[40 of 40 positions shown; findings below may reference images not displayed]

FINDINGS: TENDONS

Peroneal: Intact peroneus longus and peroneus brevis tendons.

Posteromedial: Intact tibialis posterior, flexor hallucis longus and
flexor digitorum longus tendons.

Anterior: Intact tibialis anterior, extensor hallucis longus and
extensor digitorum longus tendons.

Achilles: Intact.

Plantar Fascia: Intact.

LIGAMENTS

Lateral: Anterior talofibular ligament appears thin but is intact.
Calcaneofibular ligament intact. Posterior talofibular ligament
intact. Anterior and posterior tibiofibular ligaments intact.

Medial: Deltoid ligament intact. Spring ligament intact.

CARTILAGE

Ankle Joint: No joint effusion. Normal ankle mortise. No chondral
defect.

Subtalar Joints/Sinus Tarsi: Normal subtalar joints. No subtalar
joint effusion. Normal sinus tarsi.

Bones: No marrow signal abnormality.  No fracture or dislocation.

Soft Tissue: There is prominent subcutaneous fat underlying the
palpable area of concern along the medial posterior ankle. This area
measures 3.0 x 1.0 cm but is not well-defined or definitely
encapsulated. There is a superficial vein which courses through
this.
IMPRESSION: IMPRESSION
Focal prominence of the subcutaneous fat at the posteromedial ankle
corresponding to the palpable area of concern, without well-defined
or encapsulated fatty mass. A superficial vein courses through this.
No atypical features.

No acute tendon tear or significant tenosynovitis.

No acute osseous abnormality.

## 2021-11-15 MED ORDER — GADOBENATE DIMEGLUMINE 529 MG/ML IV SOLN
17.0000 mL | Freq: Once | INTRAVENOUS | Status: AC | PRN
  Administered 2021-11-15: 17 mL via INTRAVENOUS

## 2021-11-15 NOTE — Progress Notes (Signed)
°  Subjective:  Patient ID: Melissa Olsen, female    DOB: 18-Dec-1965,  MRN: [id number]  Left foot and ankle pain possible Achilles strain  56 y.o. female presents with the above complaint. History confirmed with patient.  This is been going on for over 3 weeks.  She noticed a lump on the inside of the ankle and Achilles and its very tender and painful to palpation.  Objective:  Physical Exam: warm, good capillary refill, no trophic changes or ulcerative lesions, normal DP and PT pulses, normal sensory exam, and she has pain in the posterior left ankle just anterior to the Achilles tendon there is a painful subcutaneous mass in this area that is soft and fluctuant.  She has pain along the PT tendon course and at its insertion as well as with resisted inversion.   Radiographs: Multiple views x-ray of the left foot and ankle: no fracture, dislocation, swelling or degenerative changes noted and no osseous abnormalities in the area of concern Assessment:   1. Spontaneous rupture of flexor tendon of left ankle   2. Neoplasm of musculoskeletal system      Plan:  Patient was evaluated and treated and all questions answered.  She has a significant mass in the posterior ankle just anterior to the Achilles tendon.  Chief concern would be for a development of a musculoskeletal tumor or potentially a rupture of the tendon with retraction causing the lump.  I recommend an MRI to evaluate for this.  I will see her back after the MRI which has been ordered as an urgent study.  In the meantime anti-inflammatory relief with prednisone taper as well as CAM boot immobilization.  She cannot take NSAIDs due to peptic ulcer disease.  Return for after MRI to review.

## 2021-11-22 ENCOUNTER — Other Ambulatory Visit: Payer: Self-pay

## 2021-11-22 ENCOUNTER — Ambulatory Visit (INDEPENDENT_AMBULATORY_CARE_PROVIDER_SITE_OTHER): Payer: BC Managed Care – PPO | Admitting: Podiatry

## 2021-11-22 DIAGNOSIS — D1724 Benign lipomatous neoplasm of skin and subcutaneous tissue of left leg: Secondary | ICD-10-CM | POA: Diagnosis not present

## 2021-11-26 ENCOUNTER — Encounter: Payer: Self-pay | Admitting: Podiatry

## 2021-11-26 NOTE — Progress Notes (Signed)
°  Subjective:  Patient ID: Melissa Olsen, female    DOB: Feb 17, 1966,  MRN: [id number]  Left foot and ankle pain possible Achilles strain  56 y.o. female presents with the above complaint. History confirmed with patient.  This is been going on for over 3 weeks.  She noticed a lump on the inside of the ankle and Achilles and its very tender and painful to palpation.  Interval history Since last visit the mass is still quite painful.  The prednisone and boot did not help.  She completed the MRI.  Objective:  Physical Exam: warm, good capillary refill, no trophic changes or ulcerative lesions, normal DP and PT pulses, normal sensory exam, and she has pain in the posterior left ankle just anterior to the Achilles tendon there is a painful subcutaneous mass in this area that is soft and fluctuant.  She has pain along the PT tendon course and at its insertion as well as with resisted inversion.   Radiographs: Multiple views x-ray of the left foot and ankle: no fracture, dislocation, swelling or degenerative changes noted and no osseous abnormalities in the area of concern   MRI 11/15/2021  IMPRESSION Focal prominence of the subcutaneous fat at the posteromedial ankle corresponding to the palpable area of concern, without well-defined or encapsulated fatty mass. A superficial vein courses through this. No atypical features.   No acute tendon tear or significant tenosynovitis.   No acute osseous abnormality.     Electronically Signed   By: Maurine Simmering M.D.   On: 11/15/2021 08:42 Assessment:   1. 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  I discussed results of the MRI report and reviewed the images in person with her in detail during the visit today.  We discussed that I suspect this is likely a inflamed benign lipoma of the leg.  Discussed further treatment options including  injection and further anti-inflammatories.  The prednisone was not helpful.  I think she will have minimal benefit from this.  She says she would feel better having the mass excised.  We discussed the risk benefits and potential complications including not limited to pain, swelling, infection, scar, numbness which may be temporary or permanent, chronic pain, stiffness, nerve pain or damage, wound healing problems.  All questions were addressed.  No guarantees as to the outcome of the surgery were made.  Informed consent was signed and reviewed.   Surgical plan:  Procedure: -Excision lipoma left leg  Location: -[facility]  Anesthesia plan: -IV sedation with local block  Postoperative pain plan: - Tylenol 1000 mg every 6 hours, ibuprofen 600 mg every 6 hours, gabapentin 300 mg every 8 hours x5 days, oxycodone 5 mg 1-2 tabs every 6 hours only as needed  DVT prophylaxis: -None required  WB Restrictions / DME needs: -WBAT in CAM boot postop    No follow-ups on file.

## 2021-12-19 ENCOUNTER — Telehealth: Payer: Self-pay | Admitting: Urology

## 2021-12-19 NOTE — Telephone Encounter (Signed)
DOS - 01/18/22 ? ?EXC. SUBCUTANEOUS TISSUE LEFT --- 16384 ? ?[employer] EFFECTIVE DATE - 10/07/20 ? ?PLAN DEDUCTIBLE - $4,000.00 W/ $1,123.00 REMAINING ?OUT OF POCKET - $6,000.00 W/ $3,123.00 REMAINING ?COINSURANCE - 0% ?COPAY - $0.00 ? ? ?SPOKE WITH ANNA WITH [employer] AND SHE STATED THAT FOR CPT CODE 53646 NO PRIOR AUTH IS REQUIRED. ? ?REF # I - [id number] ?

## 2022-01-07 ENCOUNTER — Encounter: Payer: Self-pay | Admitting: Podiatry

## 2022-01-07 ENCOUNTER — Telehealth: Payer: Self-pay

## 2022-01-07 NOTE — Telephone Encounter (Signed)
Melissa Olsen called to cancel her surgery with Dr. Sherryle Lis on 01/18/2022. She stated she is scared to have surgery. She will call back if she decides to reschedule. Notified Dr. Sherryle Lis and Caren Griffins with [facility]. ?

## 2022-01-07 NOTE — Telephone Encounter (Signed)
Thanks

## 2022-01-24 ENCOUNTER — Encounter: Payer: BC Managed Care – PPO | Admitting: Podiatry

## 2022-02-07 ENCOUNTER — Encounter: Payer: BC Managed Care – PPO | Admitting: Podiatry

## 2022-02-28 ENCOUNTER — Encounter: Payer: BC Managed Care – PPO | Admitting: Podiatry

## 2022-05-02 ENCOUNTER — Other Ambulatory Visit: Payer: Self-pay | Admitting: Internal Medicine

## 2023-11-15 ENCOUNTER — Emergency Department (HOSPITAL_COMMUNITY)
Admission: EM | Admit: 2023-11-15 | Discharge: 2023-11-15 | Disposition: A | Discharge status: Home / Self Care | Payer: Self-pay | Attending: Emergency Medicine | Admitting: Emergency Medicine

## 2023-11-15 ENCOUNTER — Emergency Department (HOSPITAL_COMMUNITY): Payer: Self-pay

## 2023-11-15 DIAGNOSIS — S0181XA Laceration without foreign body of other part of head, initial encounter: Secondary | ICD-10-CM | POA: Diagnosis not present

## 2023-11-15 DIAGNOSIS — Y92002 Bathroom of unspecified non-institutional (private) residence single-family (private) house as the place of occurrence of the external cause: Secondary | ICD-10-CM | POA: Diagnosis not present

## 2023-11-15 DIAGNOSIS — W01198A Fall on same level from slipping, tripping and stumbling with subsequent striking against other object, initial encounter: Secondary | ICD-10-CM | POA: Insufficient documentation

## 2023-11-15 DIAGNOSIS — D72829 Elevated white blood cell count, unspecified: Secondary | ICD-10-CM | POA: Diagnosis not present

## 2023-11-15 DIAGNOSIS — W19XXXA Unspecified fall, initial encounter: Secondary | ICD-10-CM

## 2023-11-15 DIAGNOSIS — R55 Syncope and collapse: Secondary | ICD-10-CM | POA: Insufficient documentation

## 2023-11-15 DIAGNOSIS — S0990XA Unspecified injury of head, initial encounter: Secondary | ICD-10-CM

## 2023-11-15 LAB — CBC WITH DIFFERENTIAL/PLATELET
Abs Immature Granulocytes: 0.07 10*3/uL (ref 0.00–0.07)
Basophils Absolute: 0 10*3/uL (ref 0.0–0.1)
Basophils Relative: 0 %
Eosinophils Absolute: 0.1 10*3/uL (ref 0.0–0.5)
Eosinophils Relative: 0 %
HCT: 36.9 % (ref 36.0–46.0)
Hemoglobin: 12.7 g/dL (ref 12.0–15.0)
Immature Granulocytes: 1 %
Lymphocytes Relative: 12 %
Lymphs Abs: 1.4 10*3/uL (ref 0.7–4.0)
MCH: 29.5 pg (ref 26.0–34.0)
MCHC: 34.4 g/dL (ref 30.0–36.0)
MCV: 85.6 fL (ref 80.0–100.0)
Monocytes Absolute: 0.6 10*3/uL (ref 0.1–1.0)
Monocytes Relative: 6 %
Neutro Abs: 9.5 10*3/uL — ABNORMAL HIGH (ref 1.7–7.7)
Neutrophils Relative %: 81 %
Platelets: 259 10*3/uL (ref 150–400)
RBC: 4.31 MIL/uL (ref 3.87–5.11)
RDW: 14.5 % (ref 11.5–15.5)
WBC: 11.6 10*3/uL — ABNORMAL HIGH (ref 4.0–10.5)
nRBC: 0 % (ref 0.0–0.2)

## 2023-11-15 LAB — BASIC METABOLIC PANEL
Anion gap: 11 (ref 5–15)
BUN: 15 mg/dL (ref 6–20)
CO2: 25 mmol/L (ref 22–32)
Calcium: 9.7 mg/dL (ref 8.9–10.3)
Chloride: 105 mmol/L (ref 98–111)
Creatinine, Ser: 0.89 mg/dL (ref 0.44–1.00)
GFR, Estimated: >60 mL/min (ref >60)
Glucose, Bld: 137 mg/dL — ABNORMAL HIGH (ref 70–99)
Potassium: 3.7 mmol/L (ref 3.5–5.1)
Sodium: 141 mmol/L (ref 135–145)

## 2023-11-15 MED ORDER — OXYCODONE HCL 5 MG PO TABS: 5.0000 mg | ORAL_TABLET | Freq: Four times a day (QID) | ORAL | 0 refills | Status: AC | PRN

## 2023-11-15 MED ORDER — FENTANYL CITRATE PF 50 MCG/ML IJ SOSY
25.0000 ug | PREFILLED_SYRINGE | Freq: Once | INTRAMUSCULAR | Status: AC
  Administered 2023-11-15: 25 ug via INTRAVENOUS
  Filled 2023-11-15: qty 1

## 2023-11-15 MED ORDER — HYDROCODONE-ACETAMINOPHEN 5-325 MG PO TABS
1.0000 | ORAL_TABLET | Freq: Once | ORAL | Status: AC
Start: 2023-11-15 — End: 2023-11-15
  Administered 2023-11-15: 1 via ORAL
  Filled 2023-11-15: qty 1

## 2023-11-15 MED ORDER — BACITRACIN ZINC 500 UNIT/GM EX OINT
TOPICAL_OINTMENT | Freq: Two times a day (BID) | CUTANEOUS | Status: DC
  Administered 2023-11-15: 1 via TOPICAL

## 2023-11-15 MED ORDER — LACTATED RINGERS IV BOLUS
1000.0000 mL | Freq: Once | INTRAVENOUS | Status: AC
  Administered 2023-11-15: 1000 mL via INTRAVENOUS

## 2023-11-15 MED ORDER — ONDANSETRON HCL 4 MG/2ML IJ SOLN
4.0000 mg | Freq: Once | INTRAMUSCULAR | Status: AC
Start: 2023-11-15 — End: 2023-11-15
  Administered 2023-11-15: 4 mg via INTRAVENOUS
  Filled 2023-11-15: qty 2

## 2023-11-15 MED ORDER — LIDOCAINE-EPINEPHRINE (PF) 2 %-1:200000 IJ SOLN
10.0000 mL | Freq: Once | INTRAMUSCULAR | Status: AC
  Administered 2023-11-15: 10 mL
  Filled 2023-11-15: qty 20

## 2023-11-15 MED ORDER — ONDANSETRON HCL 4 MG/2ML IJ SOLN
4.0000 mg | Freq: Once | INTRAMUSCULAR | Status: AC
  Administered 2023-11-15: 4 mg via INTRAVENOUS
  Filled 2023-11-15: qty 2

## 2023-11-15 MED ORDER — MORPHINE SULFATE (PF) 4 MG/ML IV SOLN
4.0000 mg | Freq: Once | INTRAVENOUS | Status: AC
  Administered 2023-11-15: 4 mg via INTRAVENOUS
  Filled 2023-11-15: qty 1

## 2023-11-15 MED ORDER — CEPHALEXIN 500 MG PO CAPS: 500.0000 mg | ORAL_CAPSULE | Freq: Three times a day (TID) | ORAL | 0 refills | Status: AC

## 2023-11-15 MED ORDER — FLUCONAZOLE 150 MG PO TABS: 150.0000 mg | ORAL_TABLET | Freq: Once | ORAL | Status: DC

## 2023-11-15 NOTE — ED Triage Notes (Signed)
 Patient BIB [facility] from home for fall to forehead in the bathroom. Patient reports to EMS that she remembers the whole fall, no thinners. EMS reports 0.5 inch indentation to right forehead with bruising to left cheek bone. A&ox4, GCS 15, able to stand and turn.

## 2023-11-15 NOTE — ED Notes (Signed)
 Patient transported to CT

## 2023-11-15 NOTE — ED Provider Notes (Signed)
 [place] EMERGENCY DEPARTMENT AT [facility] Provider Note   CSN: [id number] Arrival date & time: 11/15/23  1651     History  Chief Complaint  Patient presents with   [place]    Melissa Olsen is a 58 y.o. female.  58 year old female presents today for concern of syncopal episode that occurred just prior to arrival.  She was on the commode and then next thing she remembers is being woken up.  She believes she hit her head on the wall.  She denies any chest pain, shortness of breath, palpitations preceding this.  Denies history of syncope in the past.  No significant cardiac disease.  Does have history of CVA.  Complains of pain to the left side of her face, her head  The history is provided by the patient. No language interpreter was used.       Home Medications Prior to Admission medications   Medication Sig Start Date End Date Taking? Authorizing Provider  esomeprazole (NEXIUM) 40 MG capsule Take 40 mg by mouth daily. 03/07/21   [provider]  metFORMIN  (GLUCOPHAGE ) 500 MG tablet Take 500 mg by mouth every morning. 04/19/21   [provider]  methylPREDNISolone  (MEDROL  DOSEPAK) 4 MG TBPK tablet 6 day dose pack - take as directed 11/13/21   McDonald, Adam R, DPM  Multiple Vitamin (MULTIVITAMIN) capsule Take 1 capsule by mouth daily.    [provider]      Allergies    Patient has no known allergies.    Review of Systems   Review of Systems  Constitutional:  Negative for chills and fever.  Respiratory:  Negative for shortness of breath.   Cardiovascular:  Negative for chest pain and palpitations.  Genitourinary:  Negative for dysuria.  Skin:  Positive for wound.  Neurological:  Positive for syncope.  All other systems reviewed and are negative.   Physical Exam Updated Vital Signs SpO2 98%  Physical Exam Vitals and nursing note reviewed.  Constitutional:      General: She is not in acute distress.    Appearance: Normal appearance.  She is not ill-appearing.  HENT:     Head: Normocephalic and atraumatic.     Nose: Nose normal.  Eyes:     Conjunctiva/sclera: Conjunctivae normal.  Cardiovascular:     Rate and Rhythm: Normal rate and regular rhythm.  Pulmonary:     Effort: Pulmonary effort is normal. No respiratory distress.     Breath sounds: Normal breath sounds. No wheezing.  Abdominal:     Palpations: Abdomen is soft.  Musculoskeletal:        General: No deformity.  Skin:    Findings: No rash.  Neurological:     Mental Status: She is alert.     ED Results / Procedures / Treatments   Labs (all labs ordered are listed, but only abnormal results are displayed) Labs Reviewed  BASIC METABOLIC PANEL  CBC WITH DIFFERENTIAL/PLATELET  CBG MONITORING, ED    EKG None  Radiology No results found.  Procedures .SABRALac repair Katheryne Gorr  Date/Time: 11/15/2023 10:16 PM  Performed by: Hildegard Loge, PA-C Authorized by: Hildegard Loge, PA-C   Consent:    Consent obtained:  Verbal   Consent given by:  Patient   Risks discussed:  Need for additional repair, infection, retained foreign body, poor cosmetic result, poor wound healing and pain   Alternatives discussed:  No treatment Universal protocol:    Procedure explained and questions answered to patient or proxy's satisfaction: yes  Relevant documents present and verified: yes     Patient identity confirmed:  Verbally with patient and arm band Laceration details:    Location:  Face   Face location:  Forehead   Length (cm):  3.5 Pre-procedure details:    Preparation:  Patient was prepped and draped in usual sterile fashion Exploration:    Imaging outcome: foreign body not noted   Treatment:    Area cleansed with:  Saline and povidone-iodine   Amount of cleaning:  Extensive   Irrigation solution:  Sterile saline   Irrigation volume:  500   Irrigation method:  Tap   Debridement:  None   Undermining:  None Skin repair:    Repair method:  Sutures   Suture  size:  5-0   Suture material:  Prolene   Suture technique:  Simple interrupted   Number of sutures:  5 Approximation:    Approximation:  Close Repair type:    Repair type:  Simple Post-procedure details:    Dressing:  Non-adherent dressing and antibiotic ointment   Procedure completion:  Tolerated well, no immediate complications     Medications Ordered in ED Medications  lactated ringers  bolus 1,000 mL (has no administration in time range)  ondansetron  (ZOFRAN ) injection 4 mg (has no administration in time range)  fentaNYL  (SUBLIMAZE ) injection 25 mcg (has no administration in time range)    ED Course/ Medical Decision Making/ A&P                                 Medical Decision Making Amount and/or Complexity of Data Reviewed Labs: ordered. Radiology: ordered.  Risk OTC drugs. Prescription drug management.   Medical Decision Making / ED Course   This patient presents to the ED for concern of fall, loss of consciousness  this involves an extensive number of treatment options, and is a complaint that carries with it a high risk of complications and morbidity.  The differential diagnosis includes syncope, seizure, concussion, laceration, fracture, intracranial bleed  MDM: 58 year old female presents today for concern of syncopal episode that occurred just prior to arrival.  No prior history of this.  She was on the commode and the next exceeded remembers is waking up.  She later adds that when she woke up she remembers shaking.  I asked her daughter who does not recall anything regarding a postictal phase from any of the bystanders.  No generalized shaking that was witnessed by any other bystanders.  She was told that she was going for a long time but she states to her that did not feel like a long time.  Admission considered but will reevaluate after labs and imaging.  CBC shows mild leukocytosis but no left shift.  BMP with glucose of 137 otherwise without acute concern.   CT head, CT cervical spine, CT maxillofacial without acute concern other than soft tissue injury which will require lack repair.  See procedure note above.  Patient is concerned that she might of had a seizure episode.  No prior history of this.  She states she woke up shaking and she attributes this to be a seizure.  She is requesting a neurology follow-up.  We did discuss that this would be unlikely given that she did not have any postictal phase and she is able to recall the shaking episode.  However will give her neurology referral.  Family is at bedside and again they do not recall any  seizure-like activity.  Fluid bolus given.  Patient was maintained on telemetry.  No evidence of arrhythmia.  EKG without acute ischemic change.  Patient is stable for discharge.  Discharged in stable condition.  Wound care discussed.  Discussed close follow-up with PCP.  Sports medicine referral given for concussion given her head injury.  Told to follow-up with them if your symptoms last longer than 7 days.   Lab Tests: -I ordered, reviewed, and interpreted labs.   The pertinent results include:   Labs Reviewed  BASIC METABOLIC PANEL - Abnormal; Notable for the following components:      Result Value   Glucose, Bld 137 (*)    All other components within normal limits  CBC WITH DIFFERENTIAL/PLATELET - Abnormal; Notable for the following components:   WBC 11.6 (*)    Neutro Abs 9.5 (*)    All other components within normal limits  CBG MONITORING, ED      EKG  EKG Interpretation Date/Time:    Ventricular Rate:    PR Interval:    QRS Duration:    QT Interval:    QTC Calculation:   R Axis:      Text Interpretation:           Imaging Studies ordered: I ordered imaging studies including CT head, CT cervical spine, CT maxillofacial I independently visualized and interpreted imaging. I agree with the radiologist interpretation   Medicines ordered and prescription drug management: Meds  ordered this encounter  Medications   lactated ringers  bolus 1,000 mL   ondansetron  (ZOFRAN ) injection 4 mg   fentaNYL  (SUBLIMAZE ) injection 25 mcg   lidocaine -EPINEPHrine  (XYLOCAINE  W/EPI) 2 %-1:200000 (PF) injection 10 mL    -I have reviewed the patients home medicines and have made adjustments as needed   Cardiac Monitoring: The patient was maintained on a cardiac monitor.  I personally viewed and interpreted the cardiac monitored which showed an underlying rhythm of: NSR   Reevaluation: After the interventions noted above, I reevaluated the patient and found that they have :improved  Co morbidities that complicate the patient evaluation No past medical history on file.    Dispostion: Discharged in stable condition.  Return precaution discussed.  Patient voices understanding and is in agreement with plan.   Final Clinical Impression(s) / ED Diagnoses Final diagnoses:  Fall, initial encounter  Injury of head, initial encounter  Laceration of forehead, initial encounter  Syncope and collapse    Rx / DC Orders ED Discharge Orders          Ordered    oxyCODONE  (ROXICODONE ) 5 MG immediate release tablet  Every 6 hours PRN        11/15/23 2209    cephALEXin  (KEFLEX ) 500 MG capsule  3 times daily        11/15/23 2209              Hildegard Loge, PA-C 11/15/23 2217    Yolande Lamar BROCKS, MD 11/17/23 1335

## 2023-11-15 NOTE — Discharge Instructions (Addendum)
 Your workup in the emergency department today was reassuring.  You likely had a vasovagal syncopal episode.  Follow-up with your primary care provider.  Drink plenty of fluids.  Laceration on your forehead was repaired with 5 stitches.  These will need to be removed in 7 days.  You can have these removed by your primary care doctor or return to the emergency department to have these removed.  I have given you a referral to neurology at your request.  If you have any concerning symptoms return to the emergency room.  Given your head injury it is not unreasonable to assume that you will develop a concussion.  It is normal for you to have a headache that is made worse by trying to complete a task, by lights.  It is normal to occasionally have nausea and vomiting.  If you start noticing vision change, persistent nausea and vomiting and not being able to keep any food or drink down, or severe headache please return for evaluation.  Otherwise have given you a referral to sports medicine clinic that she can follow-up with if your symptoms last longer than 1 week.  Apply Neosporin over your wound to allow wound healing.  Have also sent antibiotic into the pharmacy.  If you notice any signs of infection such as worsening redness, drainage from the wound, or fever without any other source return for evaluation.

## 2023-11-20 ENCOUNTER — Telehealth (INDEPENDENT_AMBULATORY_CARE_PROVIDER_SITE_OTHER): Payer: Self-pay | Admitting: Otolaryngology

## 2023-11-20 NOTE — Telephone Encounter (Signed)
This was an incoming and outgoing call bc I had to call pt back.  Sorry for the lengthy message but the patient was frustrated and mentioned sueing the provider that did her ED visit. She felt she was not given the best care. I tried to keep her calm even by speaking to her in a very calm voice and trying to show empathy.  Pt was seen in the ED 11-15-23, ENT was on call and [facility] policy is that the pt f/u up with ENT. Pt was sent a referral by PCP [facility] to have her sutures removed by plastic surgery. I had to explain to the patient that plastic can not remove them that she would have to follow up with ENT. Pt was not happy about that because she didn't understand even after explaining that she would need to f/u with the dept that was on call. After Speaking with Dr Jearld Fenton and Ebbie Ridge pt need to f/u with ENT, if she does not want to f/u with ENT, she can f/u with PCP to have sutures removed or come back to the ED to have them removed. I also called pt PCP Carley Hammed FNP office at [facility]. I left her a message with the front staff about the pt. I called the pt and explained to her again and that I called her PCP office. As I was speaking with pt, her PCP office called. When patient came back on phone she agreed to schedule with an ENT provider at 4pm tomorrow for suture removal. Patient apologized by the end and was way more calm. I provided the patient the provider information and location and time over phone and also sent to her mychart. Pt did ask what provider did her laceration repair and I provided the name, it was Amjad, [name] incase she had anymore question for him. I told her to call if she had anymore questions or concerns.

## 2023-11-21 ENCOUNTER — Encounter (INDEPENDENT_AMBULATORY_CARE_PROVIDER_SITE_OTHER): Payer: Self-pay

## 2023-11-21 ENCOUNTER — Ambulatory Visit (INDEPENDENT_AMBULATORY_CARE_PROVIDER_SITE_OTHER): Payer: Federal, State, Local not specified - PPO

## 2023-11-21 VITALS — BP 138/81 | HR 83

## 2023-11-21 DIAGNOSIS — S0181XA Laceration without foreign body of other part of head, initial encounter: Secondary | ICD-10-CM | POA: Diagnosis not present

## 2023-11-21 DIAGNOSIS — S0993XA Unspecified injury of face, initial encounter: Secondary | ICD-10-CM

## 2023-11-21 NOTE — Progress Notes (Signed)
Otolaryngology Clinic Note HPI:  Melissa Olsen is a 58 y.o. female kindly referred for ED follow up for evaluation of facial trauma.  Initial visit (11/2023):   Patient reports: fall after she hit her head with LOC. Does not remember event. Had left forehead laceration. This was repaired in ED. Otherwise no significant symptoms/facial fractures. Reports she is doing bacitracin ointment to the incision. No worsening swelling, fevers, or noted dehiscence according to patient. She does report history of sinusitis and dysphagia for which she is seeing an ENT in [place] currently. She has had ESS in 2001 and 2005.  Patient denies:  - other lacerations, malocclusion, teeth instability, trismus - enophthalmos, hypoglobus, vision loss - trouble chewing or swallowing - epistaxis, hearing loss after trauma, nasal obstruction - otorrhea, vertigo.   Personal or FHx of bleeding dz or anesthesia difficulty: no   Independent Review of Additional Tests or Records:  ED visit 11/15/2023 (Dr. Eloise Harman): noted syncopal episode, hit her head after fall; noted forehead laceration, which was repaired with 5-0 prolene. CT Face (11/15/2023) independently interpreted: pansunisitis s/p prior FESS; left forehead laceration; no obvious facial fractures; mastoids, ME well aerated  PMH/Meds/All/SocHx/FamHx/ROS:  History reviewed. No pertinent past medical history.   History reviewed. No pertinent surgical history.  History reviewed. No pertinent family history.   Social Connections: Moderately Integrated (11/20/2023)   Received from [facility]   Social Network    How would you rate your social network (family, work, friends)?: Adequate participation with social networks      Current Outpatient Medications:    cephALEXin (KEFLEX) 500 MG capsule, Take 1 capsule (500 mg total) by mouth 3 (three) times daily for 7 days., Disp: 21 capsule, Rfl: 0   esomeprazole (NEXIUM) 40 MG capsule, Take 40 mg by mouth daily.,  Disp: , Rfl:    metFORMIN (GLUCOPHAGE) 500 MG tablet, Take 500 mg by mouth every morning., Disp: , Rfl:    methylPREDNISolone (MEDROL DOSEPAK) 4 MG TBPK tablet, 6 day dose pack - take as directed, Disp: 21 tablet, Rfl: 0   Multiple Vitamin (MULTIVITAMIN) capsule, Take 1 capsule by mouth daily., Disp: , Rfl:    oxyCODONE (ROXICODONE) 5 MG immediate release tablet, Take 1 tablet (5 mg total) by mouth every 6 (six) hours as needed for severe pain (pain score 7-10)., Disp: 10 tablet, Rfl: 0   Physical Exam:   BP 138/81 (BP Location: Right Arm, Cuff Size: Normal)   Pulse 83   SpO2 98%   Salient findings:  CN II-XII intact; EOM intact, no hypoglobus or enophthalmos; reports V1-3 intact except for some peri-laceration numbness  Bilateral EAC clear and TM intact with well pneumatized middle ear spaces Anterior rhinoscopy: Septum intact; mild b/l ITH; nasal endoscopy deferred; no septal hematoma Some bruising left forehead and around left side of nose which is resolving; stellate left forehead laceration extending to medial brow - mild/modest edema surrounding but no fluctuance, or significant redness or tenderness. Some areas of de-epithelialization but looks intact, clean, dry. 5 prolenes in place, removed. Brow movement present around laceration No midface or mandible stepoffs No lesions of oral cavity/oropharynx; no chipped teeth No obviously palpable neck masses No respiratory distress or stridor  Seprately Identifiable Procedures:  Forehead prolenes removed  Impression & Plans:  Melissa Olsen is a 58 y.o. female with:  1. Facial trauma, initial encounter   2. Forehead laceration, initial encounter    Prolenes removed. No fractures identified. We discussed management of lacerations - no signs of infection;  would switch to vaseline now for 2 weeks; sunscreen after 1-2 weeks; also discussed gentle massage in 3-4 weeks if wound intact and silicone scar cream afterwards; briefly discussed  dermabrasion and later scar revision if she wishes F/u PRN with any concerns See below regarding exact medications prescribed this encounter including dosages and route: No orders of the defined types were placed in this encounter.     Thank you for allowing me the opportunity to care for your patient. Please do not hesitate to contact me should you have any other questions.  Sincerely, Jovita Kussmaul, MD Otolaryngologist (ENT), [facility] Phone: [phone] Fax: [phone]  11/21/2023, 4:49 PM   I have personally spent 48 minutes involved in face-to-face and non-face-to-face activities for this patient on the day of the visit.  Professional time spent excludes any procedures performed but includes the following activities, in addition to those noted in the documentation: preparing to see the patient (review of outside documentation and results), performing a medically appropriate examination, counseling, documenting in the electronic health record, independently interpreting results (CT).
